# Patient Record
Sex: Female | Born: 1965
Health system: Southern US, Community
[De-identification: ages and names within clinical notes are randomized; demographics above are authoritative.]

## PROBLEM LIST (undated history)

## (undated) DIAGNOSIS — E785 Hyperlipidemia, unspecified: Secondary | ICD-10-CM

## (undated) DIAGNOSIS — N6009 Solitary cyst of unspecified breast: Secondary | ICD-10-CM

## (undated) DIAGNOSIS — R030 Elevated blood-pressure reading, without diagnosis of hypertension: Secondary | ICD-10-CM

## (undated) DIAGNOSIS — R195 Other fecal abnormalities: Secondary | ICD-10-CM

## (undated) HISTORY — DX: Hyperlipidemia, unspecified: E78.5

## (undated) HISTORY — DX: Solitary cyst of unspecified breast: N60.09

## (undated) HISTORY — PX: OTHER SURGICAL HISTORY: SHX169

## (undated) HISTORY — DX: Elevated blood-pressure reading, without diagnosis of hypertension: R03.0

## (undated) HISTORY — DX: Other fecal abnormalities: R19.5

---

## 2000-11-10 ENCOUNTER — Other Ambulatory Visit: Admission: RE | Admit: 2000-11-10 | Discharge: 2000-11-10 | Payer: Self-pay | Admitting: Obstetrics and Gynecology

## 2006-12-09 ENCOUNTER — Other Ambulatory Visit: Admission: RE | Admit: 2006-12-09 | Discharge: 2006-12-09 | Payer: Self-pay | Admitting: Obstetrics and Gynecology

## 2007-07-21 ENCOUNTER — Ambulatory Visit (HOSPITAL_COMMUNITY): Admission: RE | Admit: 2007-07-21 | Discharge: 2007-07-21 | Payer: Self-pay | Admitting: Obstetrics & Gynecology

## 2008-01-12 ENCOUNTER — Other Ambulatory Visit: Admission: RE | Admit: 2008-01-12 | Discharge: 2008-01-12 | Payer: Self-pay | Admitting: Obstetrics and Gynecology

## 2008-08-15 ENCOUNTER — Ambulatory Visit (HOSPITAL_COMMUNITY): Admission: RE | Admit: 2008-08-15 | Discharge: 2008-08-15 | Payer: Self-pay | Admitting: Family Medicine

## 2009-06-09 ENCOUNTER — Other Ambulatory Visit: Admission: RE | Admit: 2009-06-09 | Discharge: 2009-06-09 | Payer: Self-pay | Admitting: Obstetrics and Gynecology

## 2009-08-18 ENCOUNTER — Ambulatory Visit (HOSPITAL_COMMUNITY): Admission: RE | Admit: 2009-08-18 | Discharge: 2009-08-18 | Payer: Self-pay | Admitting: Family Medicine

## 2010-07-31 ENCOUNTER — Other Ambulatory Visit (HOSPITAL_COMMUNITY): Payer: Self-pay | Admitting: Family Medicine

## 2010-07-31 DIAGNOSIS — Z139 Encounter for screening, unspecified: Secondary | ICD-10-CM

## 2010-08-20 ENCOUNTER — Ambulatory Visit (HOSPITAL_COMMUNITY)
Admission: RE | Admit: 2010-08-20 | Discharge: 2010-08-20 | Disposition: A | Discharge status: Home / Self Care | Payer: 59 | Source: Ambulatory Visit | Attending: Family Medicine | Admitting: Family Medicine

## 2010-08-20 DIAGNOSIS — Z139 Encounter for screening, unspecified: Secondary | ICD-10-CM

## 2010-08-20 DIAGNOSIS — Z1231 Encounter for screening mammogram for malignant neoplasm of breast: Secondary | ICD-10-CM | POA: Insufficient documentation

## 2011-03-20 ENCOUNTER — Other Ambulatory Visit (HOSPITAL_COMMUNITY)
Admission: RE | Admit: 2011-03-20 | Discharge: 2011-03-20 | Disposition: A | Discharge status: Home / Self Care | Payer: 59 | Source: Ambulatory Visit | Attending: Obstetrics and Gynecology | Admitting: Obstetrics and Gynecology

## 2011-03-20 DIAGNOSIS — Z01419 Encounter for gynecological examination (general) (routine) without abnormal findings: Secondary | ICD-10-CM | POA: Insufficient documentation

## 2011-06-03 ENCOUNTER — Ambulatory Visit (HOSPITAL_COMMUNITY)
Admission: RE | Admit: 2011-06-03 | Discharge: 2011-06-03 | Disposition: A | Discharge status: Home / Self Care | Payer: 59 | Source: Ambulatory Visit | Attending: Family Medicine | Admitting: Family Medicine

## 2011-06-03 ENCOUNTER — Other Ambulatory Visit (HOSPITAL_COMMUNITY): Payer: Self-pay | Admitting: Family Medicine

## 2011-06-03 DIAGNOSIS — R52 Pain, unspecified: Secondary | ICD-10-CM

## 2011-06-03 DIAGNOSIS — M79609 Pain in unspecified limb: Secondary | ICD-10-CM | POA: Insufficient documentation

## 2011-06-03 DIAGNOSIS — M7989 Other specified soft tissue disorders: Secondary | ICD-10-CM | POA: Insufficient documentation

## 2011-09-11 ENCOUNTER — Other Ambulatory Visit (HOSPITAL_COMMUNITY): Payer: Self-pay | Admitting: Family Medicine

## 2011-09-11 DIAGNOSIS — Z139 Encounter for screening, unspecified: Secondary | ICD-10-CM

## 2011-09-16 ENCOUNTER — Ambulatory Visit (HOSPITAL_COMMUNITY)
Admission: RE | Admit: 2011-09-16 | Discharge: 2011-09-16 | Disposition: A | Discharge status: Home / Self Care | Payer: 59 | Source: Ambulatory Visit | Attending: Family Medicine | Admitting: Family Medicine

## 2011-09-16 DIAGNOSIS — Z139 Encounter for screening, unspecified: Secondary | ICD-10-CM

## 2011-09-16 DIAGNOSIS — Z1231 Encounter for screening mammogram for malignant neoplasm of breast: Secondary | ICD-10-CM | POA: Insufficient documentation

## 2012-09-01 ENCOUNTER — Other Ambulatory Visit (HOSPITAL_COMMUNITY): Payer: Self-pay | Admitting: Family Medicine

## 2012-09-01 DIAGNOSIS — Z139 Encounter for screening, unspecified: Secondary | ICD-10-CM

## 2012-09-21 ENCOUNTER — Ambulatory Visit (HOSPITAL_COMMUNITY)
Admission: RE | Admit: 2012-09-21 | Discharge: 2012-09-21 | Disposition: A | Discharge status: Home / Self Care | Payer: 59 | Source: Ambulatory Visit | Attending: Family Medicine | Admitting: Family Medicine

## 2012-09-21 DIAGNOSIS — Z1231 Encounter for screening mammogram for malignant neoplasm of breast: Secondary | ICD-10-CM | POA: Insufficient documentation

## 2012-09-21 DIAGNOSIS — Z139 Encounter for screening, unspecified: Secondary | ICD-10-CM

## 2012-09-28 ENCOUNTER — Other Ambulatory Visit: Payer: Self-pay | Admitting: Family Medicine

## 2012-09-28 DIAGNOSIS — R928 Other abnormal and inconclusive findings on diagnostic imaging of breast: Secondary | ICD-10-CM

## 2012-10-07 ENCOUNTER — Ambulatory Visit (HOSPITAL_COMMUNITY)
Admission: RE | Admit: 2012-10-07 | Discharge: 2012-10-07 | Disposition: A | Discharge status: Home / Self Care | Payer: 59 | Source: Ambulatory Visit | Attending: Family Medicine | Admitting: Family Medicine

## 2012-10-07 ENCOUNTER — Other Ambulatory Visit (HOSPITAL_COMMUNITY): Payer: Self-pay | Admitting: Family Medicine

## 2012-10-07 DIAGNOSIS — R928 Other abnormal and inconclusive findings on diagnostic imaging of breast: Secondary | ICD-10-CM

## 2012-10-07 DIAGNOSIS — N63 Unspecified lump in unspecified breast: Secondary | ICD-10-CM | POA: Insufficient documentation

## 2013-03-02 ENCOUNTER — Other Ambulatory Visit (HOSPITAL_COMMUNITY): Payer: Self-pay | Admitting: Family Medicine

## 2013-03-02 DIAGNOSIS — R928 Other abnormal and inconclusive findings on diagnostic imaging of breast: Secondary | ICD-10-CM

## 2013-03-11 ENCOUNTER — Other Ambulatory Visit (HOSPITAL_COMMUNITY)
Admission: RE | Admit: 2013-03-11 | Discharge: 2013-03-11 | Disposition: A | Discharge status: Home / Self Care | Payer: 59 | Source: Ambulatory Visit | Attending: Adult Health | Admitting: Adult Health

## 2013-03-11 ENCOUNTER — Encounter (INDEPENDENT_AMBULATORY_CARE_PROVIDER_SITE_OTHER): Payer: Self-pay

## 2013-03-11 ENCOUNTER — Ambulatory Visit (INDEPENDENT_AMBULATORY_CARE_PROVIDER_SITE_OTHER): Payer: 59 | Admitting: Adult Health

## 2013-03-11 ENCOUNTER — Encounter: Payer: Self-pay | Admitting: Adult Health

## 2013-03-11 VITALS — BP 130/78 | HR 78 | Ht 63.0 in | Wt 188.0 lb

## 2013-03-11 DIAGNOSIS — Z01419 Encounter for gynecological examination (general) (routine) without abnormal findings: Secondary | ICD-10-CM | POA: Insufficient documentation

## 2013-03-11 DIAGNOSIS — N6009 Solitary cyst of unspecified breast: Secondary | ICD-10-CM

## 2013-03-11 DIAGNOSIS — Z1151 Encounter for screening for human papillomavirus (HPV): Secondary | ICD-10-CM | POA: Insufficient documentation

## 2013-03-11 DIAGNOSIS — Z1212 Encounter for screening for malignant neoplasm of rectum: Secondary | ICD-10-CM

## 2013-03-11 HISTORY — DX: Solitary cyst of unspecified breast: N60.09

## 2013-03-11 LAB — HEMOCCULT GUIAC POC 1CARD (OFFICE): Fecal Occult Blood, POC: NEGATIVE

## 2013-03-11 NOTE — Patient Instructions (Addendum)
Physical in 1 year Labs in near future Mammogram yearly but [place] in march Colonoscopy at 48 Breast Cyst A breast cyst is a sac in the breast that is filled with fluid. Breast cysts are common in women. Women can have one or many cysts. When the breasts contain many cysts, it is usually due to a noncancerous (benign) condition called fibrocystic change. These lumps form under the influence of female hormones (estrogen and progesterone). The lumps are most often located in the upper, outer portion of the breast. They are often more swollen, painful, and tender before your period starts. They usually disappear after menopause, unless you are on hormone therapy.  There are several types of cysts:  Macrocyst. This is a cyst that is about 2 in. (5.1 cm) in diameter.   Microcyst. This is a tiny cyst that you cannot feel but can be seen with a mammogram or an ultrasound.   Galactocele. This is a cyst containing milk that may develop if you suddenly stop breastfeeding.   Sebaceous cyst of the skin. This type of cyst is not in the breast tissue itself. Breast cysts do not increase your risk of breast cancer. However, they must be monitored closely because they can be cancerous.  CAUSES  It is not known exactly what causes a breast cyst to form. Possible causes include:  An overgrowth of milk glands and connective tissue in the breast can block the milk glands, causing them to fill with fluid.   Scar tissue in the breast from previous surgery may block the glands, causing a cyst.  RISK FACTORS Estrogen may influence the development of a breast cyst.  SIGNS AND SYMPTOMS   Feeling a smooth, round, soft lump (like a grape) in the breast that is easily moveable.   Breast discomfort or pain.  Increase in size of the lump before your menstrual period and decrease in its size after your menstrual period.  DIAGNOSIS  A cyst can be felt during a physical exam by your health care provider. A breast  X-ray exam (mammogram) and ultrasonography will be done to confirm the diagnosis. Fluid may be removed from the cyst with a needle (fine needle aspiration) to make sure the cyst is not cancerous.  TREATMENT  Treatment may not be necessary. Your health care provider may monitor the cyst to see if it goes away on its own. If treatment is needed, it may include:  Hormone treatment.   Needle aspiration. There is a chance of the cyst coming back after aspiration.   Surgery to remove the whole cyst.  HOME CARE INSTRUCTIONS   Keep all follow-up appointments with your health care provider.  See your health care provider regularly:  Get a yearly exam by your health care provider.  Have a clinical breast exam by a health care provider every 1 3 years if you are 23 46 years of age. After age 70 years, you should have the exam every year.   Get mammogram tests as directed by your health care provider.   Understand the normal appearance and feel of your breasts and perform breast self-exams.   Only take over-the-counter or prescription medicines as directed by your health care provider.   Wear a supportive bra, especially when exercising.   Avoid caffeine.   Reduce your salt intake, especially before your menstrual period. Too much salt can cause fluid retention, breast swelling, and discomfort.  SEEK MEDICAL CARE IF:   You feel, or think you feel, a lump  in your breast.   You notice that both breasts look or feel different than usual.   Your breast is still causing pain after your menstrual period is over.   You need medicine for breast pain and swelling that occurs with your menstrual period.  SEEK IMMEDIATE MEDICAL CARE IF:   You have severe pain, tenderness, redness, or warmth in your breast.   You have nipple discharge or bleeding.   Your breast lump becomes hard and painful.   You find new lumps or bumps that were not there before.   You feel lumps in your  armpit (axilla).   You notice dimpling or wrinkling of the breast or nipple.   You have a fever.  MAKE SURE YOU:  Understand these instructions.  Will watch your condition.  Will get help right away if you are not doing well or get worse. Document Released: 01/21/2005 Document Revised: 09/23/2012 Document Reviewed: 08/20/2012 [facility] Patient Information 2014 [place], [place].

## 2013-03-11 NOTE — Progress Notes (Signed)
Patient ID: Natasha Oneal, female   DOB: 1965-05-15, 48 y.o.   MRN: [id number] History of Present Illness: Natasha Oneal is a 48 year old white female married in for a pap and physical.No complaints.Had flu shot at work.   Current Medications, Allergies, Past Medical History, Past Surgical History, Family History and Social History were reviewed in [employer] record.     Review of Systems: Patient denies any headaches, blurred vision, shortness of breath, chest pain, abdominal pain, problems with bowel movements, urination, or intercourse. No joint pain or mood swings. Did have a left breast cyst on mammogram in August, and [place] in September.    Physical Exam:BP 130/78  Pulse 78  Ht 5\' 3"  (1.6 m)  Wt 188 lb (85.276 kg)  BMI 33.31 kg/m2  LMP 03/06/2013 General:  Well developed, well nourished, no acute distress Skin:  Warm and dry Neck:  Midline trachea, normal thyroid Lungs; Clear to auscultation bilaterally Breast:  No dominant palpable mass, retraction, or nipple discharge Cardiovascular: Regular rate and rhythm Abdomen:  Soft, non tender, no hepatosplenomegaly Pelvic:  External genitalia is normal in appearance.  The vagina is normal in appearance.  The cervix is bulbous, pap with HPV performed.  Uterus is felt to be normal size, shape, and contour.  No  adnexal masses or tenderness noted. Rectal: Good sphincter tone, no polyps, or hemorrhoids felt.  Hemoccult negative. Extremities:  No swelling or varicosities noted Psych:  No mood changes, alert and cooperative, seems happy   Impression: Yearly gyn exam Left breast cyst    Plan: Physical in 1 year, pap in 3 years if -HPV Mammogram yearly get [place] in March Colonoscopy at 75 Get fasting labs in near future, CBC,CMP,TSH and lipids Review handout on breast cyst  Call prn

## 2013-04-07 ENCOUNTER — Ambulatory Visit (HOSPITAL_COMMUNITY)
Admission: RE | Admit: 2013-04-07 | Discharge: 2013-04-07 | Disposition: A | Discharge status: Home / Self Care | Payer: 59 | Source: Ambulatory Visit | Attending: Family Medicine | Admitting: Family Medicine

## 2013-04-07 ENCOUNTER — Other Ambulatory Visit (HOSPITAL_COMMUNITY): Payer: Self-pay | Admitting: Family Medicine

## 2013-04-07 DIAGNOSIS — N6009 Solitary cyst of unspecified breast: Secondary | ICD-10-CM | POA: Insufficient documentation

## 2013-04-07 DIAGNOSIS — R928 Other abnormal and inconclusive findings on diagnostic imaging of breast: Secondary | ICD-10-CM

## 2013-08-03 ENCOUNTER — Other Ambulatory Visit (HOSPITAL_COMMUNITY): Payer: Self-pay | Admitting: Family Medicine

## 2013-08-03 DIAGNOSIS — Z139 Encounter for screening, unspecified: Secondary | ICD-10-CM

## 2013-09-22 ENCOUNTER — Ambulatory Visit (HOSPITAL_COMMUNITY)
Admission: RE | Admit: 2013-09-22 | Discharge: 2013-09-22 | Disposition: A | Discharge status: Home / Self Care | Payer: 59 | Source: Ambulatory Visit | Attending: Family Medicine | Admitting: Family Medicine

## 2013-09-22 DIAGNOSIS — Z1231 Encounter for screening mammogram for malignant neoplasm of breast: Secondary | ICD-10-CM | POA: Insufficient documentation

## 2013-09-22 DIAGNOSIS — Z139 Encounter for screening, unspecified: Secondary | ICD-10-CM

## 2013-12-06 ENCOUNTER — Encounter: Payer: Self-pay | Admitting: Adult Health

## 2014-03-16 ENCOUNTER — Ambulatory Visit (INDEPENDENT_AMBULATORY_CARE_PROVIDER_SITE_OTHER): Payer: 59 | Admitting: Adult Health

## 2014-03-16 ENCOUNTER — Encounter: Payer: Self-pay | Admitting: Adult Health

## 2014-03-16 VITALS — BP 140/88 | HR 78 | Ht 63.5 in | Wt 172.0 lb

## 2014-03-16 DIAGNOSIS — Z01419 Encounter for gynecological examination (general) (routine) without abnormal findings: Secondary | ICD-10-CM

## 2014-03-16 DIAGNOSIS — R030 Elevated blood-pressure reading, without diagnosis of hypertension: Secondary | ICD-10-CM

## 2014-03-16 DIAGNOSIS — R195 Other fecal abnormalities: Secondary | ICD-10-CM

## 2014-03-16 DIAGNOSIS — Z1212 Encounter for screening for malignant neoplasm of rectum: Secondary | ICD-10-CM

## 2014-03-16 DIAGNOSIS — IMO0001 Reserved for inherently not codable concepts without codable children: Secondary | ICD-10-CM | POA: Insufficient documentation

## 2014-03-16 HISTORY — DX: Reserved for inherently not codable concepts without codable children: IMO0001

## 2014-03-16 HISTORY — DX: Other fecal abnormalities: R19.5

## 2014-03-16 LAB — HEMOCCULT GUIAC POC 1CARD (OFFICE): FECAL OCCULT BLD: POSITIVE

## 2014-03-16 NOTE — Progress Notes (Signed)
Patient ID: Natasha Oneal, female   DOB: 10/13/1965, 49 y.o.   MRN: [id number] History of Present Illness: Natasha Oneal is a 49 year old white female, married, in for well woman gyn exam.She had normal pap with negative HPV 03/11/13.She had cellulitis left breast in October.Her A1c was 5.7 in November and she has decreased carbs and has lost 16 lbs since last years visit.   Current Medications, Allergies, Past Medical History, Past Surgical History, Family History and Social History were reviewed in [employer] record.     Review of Systems: Patient denies any headaches, hearing loss, fatigue, blurred vision, shortness of breath, chest pain, abdominal pain, problems with bowel movements, urination, or intercourse. No joint pain or mood swings.   Physical Exam:BP 140/88 mmHg  Pulse 78  Ht 5' 3.5" (1.613 m)  Wt 172 lb (78.019 kg)  BMI 29.99 kg/m2BP was 148/90 on arrival General:  Well developed, well nourished, no acute distress Skin:  Warm and dry Neck:  Midline trachea, normal thyroid, good ROM, no lymphadenopathy Lungs; Clear to auscultation bilaterally Breast:  No dominant palpable mass, retraction, or nipple discharge Cardiovascular: Regular rate and rhythm Abdomen:  Soft, non tender, no hepatosplenomegaly Pelvic:  External genitalia is normal in appearance, no lesions.  The vagina is normal in appearance, wit good color,mositure and rugae. Urethra has no lesions or masses. The cervix is bulbous.  Uterus is felt to be normal size, shape, and contour.  No adnexal masses or tenderness noted.Bladder is non tender, no masses felt. Rectal: Good sphincter tone, no polyps, or hemorrhoids felt.  Hemoccult positive . Extremities/musculoskeletal:  No swelling or varicosities noted, no clubbing or cyanosis Psych:  No mood changes, alert and cooperative,seems happy Discussed doing 3 hemoccult cards and if any positive will refer to GI, but could be hemorrhoid, tear or  irritation.  Impression: Well woman gyn exam no pap +hemoccult Elevated BP    Plan: Check BP at work and let me know readings Will send 3 hemoccult cards home to do and return Decrease salt Mammogram yearly Physical in 1 year

## 2014-03-16 NOTE — Patient Instructions (Signed)
physical in 1 year Mammogram yearly Check BP  Do cards and return

## 2014-03-22 ENCOUNTER — Other Ambulatory Visit (INDEPENDENT_AMBULATORY_CARE_PROVIDER_SITE_OTHER): Payer: 59

## 2014-03-22 DIAGNOSIS — Z01419 Encounter for gynecological examination (general) (routine) without abnormal findings: Secondary | ICD-10-CM

## 2014-03-22 DIAGNOSIS — Z1212 Encounter for screening for malignant neoplasm of rectum: Secondary | ICD-10-CM

## 2014-03-23 LAB — HEMOCCULT GUIAC POC 1CARD (OFFICE)
Card #3 Fecal Occult Blood, POC: NEGATIVE
FECAL OCCULT BLD: NEGATIVE
Fecal Occult Blood, POC: NEGATIVE

## 2014-03-23 NOTE — Progress Notes (Signed)
Pt brought in hemocult cards. They were negative x 3. Pt aware. [place]

## 2014-08-22 ENCOUNTER — Other Ambulatory Visit (HOSPITAL_COMMUNITY): Payer: Self-pay | Admitting: Family Medicine

## 2014-08-22 DIAGNOSIS — Z1231 Encounter for screening mammogram for malignant neoplasm of breast: Secondary | ICD-10-CM

## 2014-09-28 ENCOUNTER — Ambulatory Visit (HOSPITAL_COMMUNITY)
Admission: RE | Admit: 2014-09-28 | Discharge: 2014-09-28 | Disposition: A | Discharge status: Home / Self Care | Payer: 59 | Source: Ambulatory Visit | Attending: Family Medicine | Admitting: Family Medicine

## 2014-09-28 DIAGNOSIS — Z1231 Encounter for screening mammogram for malignant neoplasm of breast: Secondary | ICD-10-CM | POA: Diagnosis not present

## 2015-03-20 DIAGNOSIS — R7309 Other abnormal glucose: Secondary | ICD-10-CM | POA: Diagnosis not present

## 2015-03-20 DIAGNOSIS — R030 Elevated blood-pressure reading, without diagnosis of hypertension: Secondary | ICD-10-CM | POA: Diagnosis not present

## 2015-03-20 DIAGNOSIS — E669 Obesity, unspecified: Secondary | ICD-10-CM | POA: Diagnosis not present

## 2015-04-27 DIAGNOSIS — E669 Obesity, unspecified: Secondary | ICD-10-CM | POA: Diagnosis not present

## 2015-04-27 DIAGNOSIS — R030 Elevated blood-pressure reading, without diagnosis of hypertension: Secondary | ICD-10-CM | POA: Diagnosis not present

## 2015-04-27 DIAGNOSIS — R7309 Other abnormal glucose: Secondary | ICD-10-CM | POA: Diagnosis not present

## 2015-04-27 DIAGNOSIS — Z13220 Encounter for screening for lipoid disorders: Secondary | ICD-10-CM | POA: Diagnosis not present

## 2015-05-08 DIAGNOSIS — H5213 Myopia, bilateral: Secondary | ICD-10-CM | POA: Diagnosis not present

## 2015-05-08 DIAGNOSIS — H52223 Regular astigmatism, bilateral: Secondary | ICD-10-CM | POA: Diagnosis not present

## 2015-09-13 ENCOUNTER — Other Ambulatory Visit (HOSPITAL_COMMUNITY): Payer: Self-pay | Admitting: Family Medicine

## 2015-09-13 DIAGNOSIS — Z1231 Encounter for screening mammogram for malignant neoplasm of breast: Secondary | ICD-10-CM

## 2015-10-02 ENCOUNTER — Ambulatory Visit (HOSPITAL_COMMUNITY)
Admission: RE | Admit: 2015-10-02 | Discharge: 2015-10-02 | Disposition: A | Discharge status: Home / Self Care | Payer: 59 | Source: Ambulatory Visit | Attending: Family Medicine | Admitting: Family Medicine

## 2015-10-02 DIAGNOSIS — Z1231 Encounter for screening mammogram for malignant neoplasm of breast: Secondary | ICD-10-CM

## 2015-10-04 ENCOUNTER — Other Ambulatory Visit: Payer: Self-pay | Admitting: Family Medicine

## 2015-10-04 DIAGNOSIS — R928 Other abnormal and inconclusive findings on diagnostic imaging of breast: Secondary | ICD-10-CM

## 2015-10-11 ENCOUNTER — Ambulatory Visit
Admission: RE | Admit: 2015-10-11 | Discharge: 2015-10-11 | Disposition: A | Discharge status: Home / Self Care | Payer: 59 | Source: Ambulatory Visit | Attending: Family Medicine | Admitting: Family Medicine

## 2015-10-11 DIAGNOSIS — R928 Other abnormal and inconclusive findings on diagnostic imaging of breast: Secondary | ICD-10-CM | POA: Diagnosis not present

## 2015-10-11 DIAGNOSIS — N6002 Solitary cyst of left breast: Secondary | ICD-10-CM | POA: Diagnosis not present

## 2016-07-04 DIAGNOSIS — H52223 Regular astigmatism, bilateral: Secondary | ICD-10-CM | POA: Diagnosis not present

## 2016-07-04 DIAGNOSIS — H5213 Myopia, bilateral: Secondary | ICD-10-CM | POA: Diagnosis not present

## 2016-07-04 DIAGNOSIS — H524 Presbyopia: Secondary | ICD-10-CM | POA: Diagnosis not present

## 2016-08-20 ENCOUNTER — Other Ambulatory Visit: Payer: 59 | Admitting: Adult Health

## 2016-09-09 ENCOUNTER — Ambulatory Visit (INDEPENDENT_AMBULATORY_CARE_PROVIDER_SITE_OTHER): Payer: 59 | Admitting: Adult Health

## 2016-09-09 ENCOUNTER — Other Ambulatory Visit (HOSPITAL_COMMUNITY)
Admission: RE | Admit: 2016-09-09 | Discharge: 2016-09-09 | Disposition: A | Discharge status: Home / Self Care | Payer: 59 | Source: Ambulatory Visit | Attending: Adult Health | Admitting: Adult Health

## 2016-09-09 ENCOUNTER — Encounter: Payer: Self-pay | Admitting: Adult Health

## 2016-09-09 VITALS — BP 120/72 | HR 86 | Ht 63.5 in | Wt 153.0 lb

## 2016-09-09 DIAGNOSIS — Z01419 Encounter for gynecological examination (general) (routine) without abnormal findings: Secondary | ICD-10-CM | POA: Insufficient documentation

## 2016-09-09 DIAGNOSIS — Z13220 Encounter for screening for lipoid disorders: Secondary | ICD-10-CM

## 2016-09-09 DIAGNOSIS — Z1212 Encounter for screening for malignant neoplasm of rectum: Secondary | ICD-10-CM

## 2016-09-09 DIAGNOSIS — Z1211 Encounter for screening for malignant neoplasm of colon: Secondary | ICD-10-CM

## 2016-09-09 DIAGNOSIS — Z131 Encounter for screening for diabetes mellitus: Secondary | ICD-10-CM

## 2016-09-09 DIAGNOSIS — Z1321 Encounter for screening for nutritional disorder: Secondary | ICD-10-CM

## 2016-09-09 LAB — HEMOCCULT GUIAC POC 1CARD (OFFICE): Fecal Occult Blood, POC: NEGATIVE

## 2016-09-09 NOTE — Progress Notes (Signed)
Patient ID: Natasha Oneal, female   DOB: 1965-11-14, 51 y.o.   MRN: [id number] History of Present Illness: Natasha "Natasha Oneal" is a 51 year old white female, married, G1P1, in for a well woman gyn exam and pap.She has lost about 35 lbs since last pap(she was trying). PCP is Dr Natasha Oneal.    Current Medications, Allergies, Past Medical History, Past Surgical History, Family History and Social History were reviewed in [employer] record.     Review of Systems: Patient denies any headaches, hearing loss, fatigue, blurred vision, shortness of breath, chest pain, abdominal pain, problems with bowel movements, urination, or intercourse. No joint pain or mood swings.    Physical Exam:BP 120/72 (BP Location: Left Arm, Patient Position: Sitting, Cuff Size: Normal)   Pulse 86   Ht 5' 3.5" (1.613 m)   Wt 153 lb (69.4 kg)   LMP 08/21/2016   BMI 26.68 kg/m  General:  Well developed, well nourished, no acute distress Skin:  Warm and dry Neck:  Midline trachea, normal thyroid, good ROM, no lymphadenopathy Lungs; Clear to auscultation bilaterally Breast:  No dominant palpable mass, retraction, or nipple discharge Cardiovascular: Regular rate and rhythm Abdomen:  Soft, non tender, no hepatosplenomegaly Pelvic:  External genitalia is normal in appearance, no lesions.  The vagina is normal in appearance. Urethra has no lesions or masses. The cervix is bulbous. Pap with HPV performed. Uterus is felt to be normal size, shape, and contour.  No adnexal masses or tenderness noted.Bladder is non tender, no masses felt. Rectal: Good sphincter tone, no polyps, or hemorrhoids felt.  Hemoccult negative. Extremities/musculoskeletal:  No swelling or varicosities noted, no clubbing or cyanosis Psych:  No mood changes, alert and cooperative,seems happy PHQ 2 score 0.  Impression: 1. Well woman exam with routine gynecological exam   2. Screening for colorectal cancer   3. Screening  cholesterol level   4. Screening for diabetes mellitus   5. Encounter for vitamin deficiency screening       Plan: Check CBC,CMP,TSH and lipids,A1c and vitamin D Mammogram yearly Colonoscopy at 52 Physical in 1 year, pap in 3 if normal

## 2016-09-10 LAB — CYTOLOGY - PAP
Diagnosis: NEGATIVE
HPV (WINDOPATH): NOT DETECTED

## 2016-09-13 ENCOUNTER — Other Ambulatory Visit (HOSPITAL_COMMUNITY): Payer: Self-pay | Admitting: Family Medicine

## 2016-09-13 DIAGNOSIS — Z13220 Encounter for screening for lipoid disorders: Secondary | ICD-10-CM | POA: Diagnosis not present

## 2016-09-13 DIAGNOSIS — Z01419 Encounter for gynecological examination (general) (routine) without abnormal findings: Secondary | ICD-10-CM | POA: Diagnosis not present

## 2016-09-13 DIAGNOSIS — Z1321 Encounter for screening for nutritional disorder: Secondary | ICD-10-CM | POA: Diagnosis not present

## 2016-09-13 DIAGNOSIS — Z1231 Encounter for screening mammogram for malignant neoplasm of breast: Secondary | ICD-10-CM

## 2016-09-13 DIAGNOSIS — Z131 Encounter for screening for diabetes mellitus: Secondary | ICD-10-CM | POA: Diagnosis not present

## 2016-09-14 LAB — COMPREHENSIVE METABOLIC PANEL
A/G RATIO: 1.8 (ref 1.2–2.2)
ALBUMIN: 4.6 g/dL (ref 3.5–5.5)
ALT: 15 IU/L (ref 0–32)
AST: 22 IU/L (ref 0–40)
Alkaline Phosphatase: 58 IU/L (ref 39–117)
BILIRUBIN TOTAL: 0.4 mg/dL (ref 0.0–1.2)
BUN/Creatinine Ratio: 22 (ref 9–23)
BUN: 14 mg/dL (ref 6–24)
CHLORIDE: 104 mmol/L (ref 96–106)
CO2: 24 mmol/L (ref 20–29)
Calcium: 9.5 mg/dL (ref 8.7–10.2)
Creatinine, Ser: 0.65 mg/dL (ref 0.57–1.00)
GFR calc Af Amer: 121 mL/min/{1.73_m2} (ref >59)
GFR, EST NON AFRICAN AMERICAN: 105 mL/min/{1.73_m2} (ref >59)
Globulin, Total: 2.5 g/dL (ref 1.5–4.5)
Glucose: 94 mg/dL (ref 65–99)
POTASSIUM: 4.4 mmol/L (ref 3.5–5.2)
Sodium: 143 mmol/L (ref 134–144)
TOTAL PROTEIN: 7.1 g/dL (ref 6.0–8.5)

## 2016-09-14 LAB — LIPID PANEL
CHOLESTEROL TOTAL: 218 mg/dL — AB (ref 100–199)
Chol/HDL Ratio: 3.3 ratio (ref 0.0–4.4)
HDL: 66 mg/dL (ref >39)
LDL Calculated: 134 mg/dL — ABNORMAL HIGH (ref 0–99)
Triglycerides: 92 mg/dL (ref 0–149)
VLDL Cholesterol Cal: 18 mg/dL (ref 5–40)

## 2016-09-14 LAB — HEMOGLOBIN A1C
ESTIMATED AVERAGE GLUCOSE: 105 mg/dL
Hgb A1c MFr Bld: 5.3 % (ref 4.8–5.6)

## 2016-09-14 LAB — CBC
HEMOGLOBIN: 13.6 g/dL (ref 11.1–15.9)
Hematocrit: 40.9 % (ref 34.0–46.6)
MCH: 29.3 pg (ref 26.6–33.0)
MCHC: 33.3 g/dL (ref 31.5–35.7)
MCV: 88 fL (ref 79–97)
Platelets: 199 10*3/uL (ref 150–379)
RBC: 4.64 x10E6/uL (ref 3.77–5.28)
RDW: 13.7 % (ref 12.3–15.4)
WBC: 5.3 10*3/uL (ref 3.4–10.8)

## 2016-09-14 LAB — VITAMIN D 25 HYDROXY (VIT D DEFICIENCY, FRACTURES): VIT D 25 HYDROXY: 28.5 ng/mL — AB (ref 30.0–100.0)

## 2016-09-14 LAB — TSH: TSH: 3.09 u[IU]/mL (ref 0.450–4.500)

## 2016-09-16 ENCOUNTER — Telehealth: Payer: Self-pay | Admitting: Adult Health

## 2016-09-16 DIAGNOSIS — E559 Vitamin D deficiency, unspecified: Secondary | ICD-10-CM | POA: Insufficient documentation

## 2016-09-16 MED ORDER — VITAMIN D 50 MCG (2000 UT) PO CAPS: ORAL_CAPSULE | ORAL | Status: DC

## 2016-09-16 NOTE — Telephone Encounter (Signed)
Left message that labs good, except vitamin D 3 a little low, take 2000 IU vitamin D 3 daily

## 2016-10-16 ENCOUNTER — Ambulatory Visit (HOSPITAL_COMMUNITY)
Admission: RE | Admit: 2016-10-16 | Discharge: 2016-10-16 | Disposition: A | Discharge status: Home / Self Care | Payer: 59 | Source: Ambulatory Visit | Attending: Family Medicine | Admitting: Family Medicine

## 2016-10-16 DIAGNOSIS — Z1231 Encounter for screening mammogram for malignant neoplasm of breast: Secondary | ICD-10-CM | POA: Diagnosis not present

## 2017-08-12 DIAGNOSIS — H524 Presbyopia: Secondary | ICD-10-CM | POA: Diagnosis not present

## 2017-08-12 DIAGNOSIS — H52223 Regular astigmatism, bilateral: Secondary | ICD-10-CM | POA: Diagnosis not present

## 2017-08-12 DIAGNOSIS — H5213 Myopia, bilateral: Secondary | ICD-10-CM | POA: Diagnosis not present

## 2017-09-18 ENCOUNTER — Encounter: Payer: Self-pay | Admitting: Adult Health

## 2017-09-25 ENCOUNTER — Other Ambulatory Visit: Payer: Self-pay | Admitting: Obstetrics and Gynecology

## 2017-09-25 DIAGNOSIS — Z1231 Encounter for screening mammogram for malignant neoplasm of breast: Secondary | ICD-10-CM

## 2017-10-14 ENCOUNTER — Other Ambulatory Visit: Payer: 59 | Admitting: Adult Health

## 2017-10-16 ENCOUNTER — Other Ambulatory Visit: Payer: 59 | Admitting: Adult Health

## 2017-10-20 ENCOUNTER — Ambulatory Visit (INDEPENDENT_AMBULATORY_CARE_PROVIDER_SITE_OTHER): Payer: 59 | Admitting: Adult Health

## 2017-10-20 ENCOUNTER — Encounter: Payer: Self-pay | Admitting: Adult Health

## 2017-10-20 ENCOUNTER — Encounter (INDEPENDENT_AMBULATORY_CARE_PROVIDER_SITE_OTHER): Payer: Self-pay

## 2017-10-20 VITALS — BP 132/86 | HR 66 | Ht 63.0 in | Wt 148.4 lb

## 2017-10-20 DIAGNOSIS — Z01419 Encounter for gynecological examination (general) (routine) without abnormal findings: Secondary | ICD-10-CM

## 2017-10-20 DIAGNOSIS — Z1211 Encounter for screening for malignant neoplasm of colon: Secondary | ICD-10-CM | POA: Insufficient documentation

## 2017-10-20 DIAGNOSIS — Z1212 Encounter for screening for malignant neoplasm of rectum: Secondary | ICD-10-CM | POA: Diagnosis not present

## 2017-10-20 DIAGNOSIS — R61 Generalized hyperhidrosis: Secondary | ICD-10-CM | POA: Insufficient documentation

## 2017-10-20 DIAGNOSIS — N951 Menopausal and female climacteric states: Secondary | ICD-10-CM | POA: Insufficient documentation

## 2017-10-20 DIAGNOSIS — Z780 Asymptomatic menopausal state: Secondary | ICD-10-CM

## 2017-10-20 LAB — HEMOCCULT GUIAC POC 1CARD (OFFICE): FECAL OCCULT BLD: NEGATIVE

## 2017-10-20 NOTE — Progress Notes (Addendum)
Patient ID: Natasha Oneal, female   DOB: 1965-06-07, 52 y.o.   MRN: [id number] History of Present Illness: Natasha Oneal is a 52 year old white female,married, in for a well woman gyn exam, she had a normal pap with negative HPV 09/09/16. PCP is Dr Karie Kirks.    Current Medications, Allergies, Past Medical History, Past Surgical History, Family History and Social History were reviewed in [employer] record.     Review of Systems: Patient denies any headaches, hearing loss, fatigue, blurred vision, shortness of breath, chest pain, abdominal pain, problems with bowel movements, urination, or intercourse. No joint pain or mood swings. No period in over a year, +hot flashes and night sweats She exercises 6 days a week and eats healthy now.   Physical Exam:BP 132/86 (BP Location: Left Arm, Patient Position: Sitting, Cuff Size: Normal)   Pulse 66   Ht 5\' 3"  (1.6 m)   Wt 148 lb 6.4 oz (67.3 kg)   LMP 08/19/2016   BMI 26.29 kg/m Has lost 40 lbs in last 4 years. General:  Well developed, well nourished, no acute distress Skin:  Warm and dry Neck:  Midline trachea, normal thyroid, good ROM, no lymphadenopathy Lungs; Clear to auscultation bilaterally Breast:  No dominant palpable mass, retraction, or nipple discharge Cardiovascular: Regular rate and rhythm Abdomen:  Soft, non tender, no hepatosplenomegaly Pelvic:  External genitalia is normal in appearance, no lesions.  The vagina is normal in appearance. Urethra has no lesions or masses. The cervix is bulbous.  Uterus is felt to be normal size, shape, and contour.  No adnexal masses or tenderness noted.Bladder is non tender, no masses felt. Rectal: Good sphincter tone, no polyps, or hemorrhoids felt.  Hemoccult negative. Extremities/musculoskeletal:  No swelling or varicosities noted, no clubbing or cyanosis Psych:  No mood changes, alert and cooperative,seems happy PHQ 9 score 0. Examination chaperoned by Diona Fanti CMA.   Discussed HRT and she declines for now.   Impression: 1. Encounter for well woman exam with routine gynecological exam   2. Screening for colorectal cancer   3. Hot flashes due to menopause   4. Night sweats   5. Post-menopausal       Plan: Physical in 1 year Pap in 2021 Mammogram 9/19 and yearly Fasting labs next year Colonoscopy advised, referred to Dr Gala Romney

## 2017-10-20 NOTE — Patient Instructions (Signed)
Menopause Menopause is the normal time of life when menstrual periods stop completely. Menopause is complete when you have missed 12 consecutive menstrual periods. It usually occurs between the ages of 48 years and 55 years. Very rarely does a woman develop menopause before the age of 40 years. At menopause, your ovaries stop producing the female hormones estrogen and progesterone. This can cause undesirable symptoms and also affect your health. Sometimes the symptoms may occur 4-5 years before the menopause begins. There is no relationship between menopause and:  Oral contraceptives.  Number of children you had.  Race.  The age your menstrual periods started (menarche).  Heavy smokers and very thin women may develop menopause earlier in life. What are the causes?  The ovaries stop producing the female hormones estrogen and progesterone. Other causes include:  Surgery to remove both ovaries.  The ovaries stop functioning for no known reason.  Tumors of the pituitary gland in the brain.  Medical disease that affects the ovaries and hormone production.  Radiation treatment to the abdomen or pelvis.  Chemotherapy that affects the ovaries.  What are the signs or symptoms?  Hot flashes.  Night sweats.  Decrease in sex drive.  Vaginal dryness and thinning of the vagina causing painful intercourse.  Dryness of the skin and developing wrinkles.  Headaches.  Tiredness.  Irritability.  Memory problems.  Weight gain.  Bladder infections.  Hair growth of the face and chest.  Infertility. More serious symptoms include:  Loss of bone (osteoporosis) causing breaks (fractures).  Depression.  Hardening and narrowing of the arteries (atherosclerosis) causing heart attacks and strokes.  How is this diagnosed?  When the menstrual periods have stopped for 12 straight months.  Physical exam.  Hormone studies of the blood. How is this treated? There are many treatment  choices and nearly as many questions about them. The decisions to treat or not to treat menopausal changes is an individual choice made with your health care provider. Your health care provider can discuss the treatments with you. Together, you can decide which treatment will work best for you. Your treatment choices may include:  Hormone therapy (estrogen and progesterone).  Non-hormonal medicines.  Treating the individual symptoms with medicine (for example antidepressants for depression).  Herbal medicines that may help specific symptoms.  Counseling by a psychiatrist or psychologist.  Group therapy.  Lifestyle changes including: ? Eating healthy. ? Regular exercise. ? Limiting caffeine and alcohol. ? Stress management and meditation.  No treatment.  Follow these instructions at home:  Take the medicine your health care provider gives you as directed.  Get plenty of sleep and rest.  Exercise regularly.  Eat a diet that contains calcium (good for the bones) and soy products (acts like estrogen hormone).  Avoid alcoholic beverages.  Do not smoke.  If you have hot flashes, dress in layers.  Take supplements, calcium, and vitamin D to strengthen bones.  You can use over-the-counter lubricants or moisturizers for vaginal dryness.  Group therapy is sometimes very helpful.  Acupuncture may be helpful in some cases. Contact a health care provider if:  You are not sure you are in menopause.  You are having menopausal symptoms and need advice and treatment.  You are still having menstrual periods after age 55 years.  You have pain with intercourse.  Menopause is complete (no menstrual period for 12 months) and you develop vaginal bleeding.  You need a referral to a specialist (gynecologist, psychiatrist, or psychologist) for treatment. Get help right   away if:  You have severe depression.  You have excessive vaginal bleeding.  You fell and think you have a  broken bone.  You have pain when you urinate.  You develop leg or chest pain.  You have a fast pounding heart beat (palpitations).  You have severe headaches.  You develop vision problems.  You feel a lump in your breast.  You have abdominal pain or severe indigestion. This information is not intended to replace advice given to you by your health care provider. Make sure you discuss any questions you have with your health care provider. Document Released: 04/13/2003 Document Revised: 06/29/2015 Document Reviewed: 08/20/2012 Elsevier Interactive Patient Education  2017 [employer]  

## 2017-10-23 ENCOUNTER — Ambulatory Visit (HOSPITAL_COMMUNITY)
Admission: RE | Admit: 2017-10-23 | Discharge: 2017-10-23 | Disposition: A | Discharge status: Home / Self Care | Payer: 59 | Source: Ambulatory Visit | Attending: Obstetrics and Gynecology | Admitting: Obstetrics and Gynecology

## 2017-10-23 DIAGNOSIS — Z1231 Encounter for screening mammogram for malignant neoplasm of breast: Secondary | ICD-10-CM | POA: Insufficient documentation

## 2017-11-03 ENCOUNTER — Encounter: Payer: Self-pay | Admitting: Internal Medicine

## 2017-11-03 ENCOUNTER — Telehealth: Payer: Self-pay | Admitting: Gastroenterology

## 2017-11-03 NOTE — Telephone Encounter (Signed)
Per Erline Levine, this message was sent in error. Wrong pt.

## 2017-11-03 NOTE — Telephone Encounter (Signed)
[phone] please call patient, he said that he is retaining a lot of fluid and his belly button is leaking, no pain but he has to keep a towel on it and can not get it to stop

## 2017-11-24 ENCOUNTER — Ambulatory Visit (INDEPENDENT_AMBULATORY_CARE_PROVIDER_SITE_OTHER): Payer: Self-pay

## 2017-11-24 DIAGNOSIS — Z1211 Encounter for screening for malignant neoplasm of colon: Secondary | ICD-10-CM

## 2017-11-24 MED ORDER — CLENPIQ 10-3.5-12 MG-GM -GM/160ML PO SOLN: 1.0000 | Freq: Once | ORAL | 0 refills | Status: AC

## 2017-11-24 MED FILL — CLENPIQ 10-3.5-12 MG-GM -GM: 10-3.5-12 M | 1 days supply | Qty: 320 | Fill #0

## 2017-11-24 NOTE — Patient Instructions (Signed)
[facility] INSTRUCTIONS   Patient Name:  CLYDINE PARKISON Date of procedure:  01/07/18 Time to register at [facility] Stay:  12:00pm Provider:  Dr. Gala Romney   Please notify us immediately if you are diabetic, take iron supplements, or if you are on coumadin or any blood thinners.  Note: Do NOT refrigerate or freeze CLENPIQ. CLENPIQ is ready to drink. There is no need to add any other liquid or mix the medicine in the bottle before you start dosing.   01/06/18  1 Day prior to procedure:     CLEAR LIQUIDS ALL DAY--NO SOLID FOODS OR DAIRY PRODUCTS! See list of liquids that are allowed and items that are NOT allowed below.     You must drink plenty of CLEAR LIQUIDS starting before your bowel prep. It is important to stay adequately hydrated before, during, and after your bowel prep for the prep to work effectively!    At 5:00 PM Begin the prep as follows:    1. Drink one bottle of premixed CLENPIQ right from the bottle. 2. Drink at least five (5) 8-ounce drinks of clear liquids of your choice within the next 5 hours   Continue clear liquids.    01/07/18  Day of Procedure   You may take TYLENOL products. Please continue your regular medications unless we have instructed you otherwise.    5 hours before procedure @ 8:00am:  1. Drink second bottle of premixed CLENPIQ right from the bottle.   2. Drink at least three (3) 8-ounce drinks of clear liquids of your choice within the next 2 hours. You can drink more if needed.   3 hours before your procedure time @ 10:00 am: Stop drinking all liquids, nothing by mouth at this point.  Please note, on the day of your procedure you MUST be accompanied by an adult who is willing to assume responsibility for you at time of discharge. If you do not have such person with you, your procedure will have to be rescheduled.                                                                                                                      Please leave ALL jewelry at home prior to coming to the hospital for your procedure.   *It is your responsibility to check with your insurance company for the benefits of coverage you have for this procedure. Unfortunately, not all insurance companies have benefits to cover all or part of these types of procedures. It is your responsibility to check your benefits, however we will be glad to assist you with any codes your insurance company may need.   Please note that most insurance companies will not cover a screening colonoscopy for people under the age of 4  For example, with some insurance companies you may have benefits for a screening colonoscopy, but if polyps are found the diagnosis will change and then you may have a deductible that will need to be met.  Please make sure you check your benefits for screening colonoscopy as well as a diagnostic colonoscopy.   CLEAR LIQUIDS: (NO RED or PURPLE) Water  Jello   Apple Juice  White Grape Juice   Kool-Aid Soft drinks  Banana popsicles Sports Drink  Black coffee (No cream or milk) Tea (No cream or milk)  Broth (fat free beef/chicken/vegetable)  Clear liquids allow you to see your fingers on the other side of the glass.  Be sure they are NOT RED or PURPLE in color, cloudy, but CLEAR.  Do Not Eat: Dairy products of any kind Cranberry juice Tomato or V8 Juice  Orange Juice   Grapefruit Juice Red Grape Juice Alcohol   Non-dairy creamer Solid foods like cereal, oatmeal, yogurt, fruits, vegetables, creamed soups, eggs, bread, etc   HELPFUL HINTS TO MAKE DRINKING EASIER: -Trying drinking through a straw. -If you become nauseated, try consuming smaller amounts or stretch out the time between glasses.  Stop for 30 minutes & slowly start back drinking.  Call our office with any questions or concerns at [phone].  Thank You

## 2017-11-24 NOTE — Progress Notes (Addendum)
Gastroenterology Pre-Procedure Review  Request Date:11/24/17 Requesting Physician: Derrek Monaco NP [facility]- no previous tcs  PATIENT REVIEW QUESTIONS: The patient responded to the following health history questions as indicated:    1. Diabetes Melitis: no 2. Joint replacements in the past 12 months: no 3. Major health problems in the past 3 months: no 4. Has an artificial valve or MVP: no 5. Has a defibrillator: no 6. Has been advised in past to take antibiotics in advance of a procedure like teeth cleaning: no 7. Family history of colon cancer: no  8. Alcohol Use: no 9. History of sleep apnea: no  10. History of coronary artery or other vascular stents placed within the last 12 months: no 11. History of any prior anesthesia complications: no    MEDICATIONS & ALLERGIES:    Patient reports the following regarding taking any blood thinners:   Plavix? no Aspirin? no Coumadin? no Brilinta? no Xarelto? no Eliquis? no Pradaxa? no Savaysa? no Effient? no  Patient confirms/reports the following medications:  Current Outpatient Medications  Medication Sig Dispense Refill  . Cholecalciferol (VITAMIN D) 2000 units CAPS Take 1 daily 30 capsule    No current facility-administered medications for this visit.     Patient confirms/reports the following allergies:  No Known Allergies  No orders of the defined types were placed in this encounter.   AUTHORIZATION INFORMATION Primary Insurance: [place],  [place] #: [id number] Pre-Cert / Josem Kaufmann required: no per Casilda Carls / Auth #: [id number]  SCHEDULE INFORMATION: Procedure has been scheduled as follows:  Date: 01/07/18, Time:1:00 Location: [facility] Dr.Rourk  This Gastroenterology Pre-Precedure Review Form is being routed to the following provider(s): Walden Field NP

## 2017-11-25 NOTE — Progress Notes (Signed)
Ok to schedule.

## 2018-01-07 ENCOUNTER — Encounter (HOSPITAL_COMMUNITY)
Admission: RE | Disposition: A | Discharge status: Home / Self Care | Payer: Self-pay | Source: Ambulatory Visit | Attending: Internal Medicine

## 2018-01-07 ENCOUNTER — Encounter (HOSPITAL_COMMUNITY): Payer: Self-pay | Admitting: *Deleted

## 2018-01-07 ENCOUNTER — Ambulatory Visit (HOSPITAL_COMMUNITY)
Admission: RE | Admit: 2018-01-07 | Discharge: 2018-01-07 | Disposition: A | Discharge status: Home / Self Care | Payer: 59 | Source: Ambulatory Visit | Attending: Internal Medicine | Admitting: Internal Medicine

## 2018-01-07 ENCOUNTER — Other Ambulatory Visit: Payer: Self-pay

## 2018-01-07 DIAGNOSIS — D123 Benign neoplasm of transverse colon: Secondary | ICD-10-CM | POA: Diagnosis not present

## 2018-01-07 DIAGNOSIS — Z79899 Other long term (current) drug therapy: Secondary | ICD-10-CM | POA: Diagnosis not present

## 2018-01-07 DIAGNOSIS — Z1211 Encounter for screening for malignant neoplasm of colon: Secondary | ICD-10-CM | POA: Insufficient documentation

## 2018-01-07 DIAGNOSIS — D122 Benign neoplasm of ascending colon: Secondary | ICD-10-CM | POA: Insufficient documentation

## 2018-01-07 HISTORY — PX: COLONOSCOPY: SHX5424

## 2018-01-07 HISTORY — PX: POLYPECTOMY: SHX5525

## 2018-01-07 SURGERY — COLONOSCOPY
Anesthesia: Moderate Sedation

## 2018-01-07 MED ORDER — SODIUM CHLORIDE 0.9 % IV SOLN
INTRAVENOUS | Status: DC
  Administered 2018-01-07: 1000 mL via INTRAVENOUS

## 2018-01-07 MED ORDER — MIDAZOLAM HCL 5 MG/5ML IJ SOLN
INTRAMUSCULAR | Status: AC
  Filled 2018-01-07: qty 10

## 2018-01-07 MED ORDER — STERILE WATER FOR IRRIGATION IR SOLN
Status: DC | PRN
  Administered 2018-01-07: 1.5 mL

## 2018-01-07 MED ORDER — ONDANSETRON HCL 4 MG/2ML IJ SOLN
INTRAMUSCULAR | Status: AC
  Filled 2018-01-07: qty 2

## 2018-01-07 MED ORDER — MEPERIDINE HCL 100 MG/ML IJ SOLN
INTRAMUSCULAR | Status: DC | PRN
  Administered 2018-01-07: 25 mg

## 2018-01-07 MED ORDER — MIDAZOLAM HCL 5 MG/5ML IJ SOLN
INTRAMUSCULAR | Status: DC | PRN
  Administered 2018-01-07: 2 mg via INTRAVENOUS
  Administered 2018-01-07 (×3): 1 mg via INTRAVENOUS

## 2018-01-07 MED ORDER — MEPERIDINE HCL 50 MG/ML IJ SOLN
INTRAMUSCULAR | Status: AC
  Filled 2018-01-07: qty 1

## 2018-01-07 NOTE — H&P (Signed)
@LOGO @   Primary Care Physician:  Lemmie Evens, MD Primary Gastroenterologist:  Dr. Gala Romney  Pre-Procedure History & Physical: HPI:  Natasha Oneal is a 52 y.o. female is here for a screening colonoscopy.  No bowel symptoms.  No prior colonoscopy.  No family history of colon cancer.  Past Medical History:  Diagnosis Date  . Breast cyst 03/11/2013   Left breast at 3 0'clock will get F/U US in march  . Elevated BP 03/16/2014  . Positive occult stool blood test 03/16/2014    Past Surgical History:  Procedure Laterality Date  . bladder stem dilitation     under general anesthesia    Prior to Admission medications   Medication Sig Start Date End Date Taking? Authorizing Provider  acetaminophen (TYLENOL) 325 MG tablet Take 650 mg by mouth every 6 (six) hours as needed.   Yes [provider]  Cholecalciferol (VITAMIN D3) 25 MCG (1000 UT) CAPS Take 1,000 Units by mouth daily.   Yes [provider]    Allergies as of 11/24/2017  . (No Known Allergies)    Family History  Problem Relation Age of Onset  . Diabetes Father   . Parkinson's disease Father   . Diabetes Paternal Aunt     Social History   Socioeconomic History  . Marital status: Married    Spouse name: Not on file  . Number of children: Not on file  . Years of education: Not on file  . Highest education level: Not on file  Occupational History  . Not on file  Social Needs  . Financial resource strain: Not on file  . Food insecurity:    Worry: Not on file    Inability: Not on file  . Transportation needs:    Medical: Not on file    Non-medical: Not on file  Tobacco Use  . Smoking status: Never Smoker  . Smokeless tobacco: Never Used  Substance and Sexual Activity  . Alcohol use: No  . Drug use: No  . Sexual activity: Yes    Birth control/protection: None, Other-see comments    Comment: vasectomy  Lifestyle  . Physical activity:    Days per week: Not on file    Minutes per session:  Not on file  . Stress: Not on file  Relationships  . Social connections:    Talks on phone: Not on file    Gets together: Not on file    Attends religious service: Not on file    Active member of club or organization: Not on file    Attends meetings of clubs or organizations: Not on file    Relationship status: Not on file  . Intimate partner violence:    Fear of current or ex partner: Not on file    Emotionally abused: Not on file    Physically abused: Not on file    Forced sexual activity: Not on file  Other Topics Concern  . Not on file  Social History Narrative  . Not on file    Review of Systems: See HPI, otherwise negative ROS  Physical Exam: BP 121/64   Pulse 73   Temp 97.8 F (36.6 C) (Oral)   Resp 12   Ht 5\' 3"  (1.6 m)   Wt 66.2 kg   LMP 08/28/2016   SpO2 99%   BMI 25.86 kg/m  General:   Alert,  Well-developed, well-nourished, pleasant and cooperative in NAD Lungs:  Clear throughout to auscultation.   No wheezes, crackles, or rhonchi.  No acute distress. Heart:  Regular rate and rhythm; no murmurs, clicks, rubs,  or gallops. Abdomen:  Soft, nontender and nondistended. No masses, hepatosplenomegaly or hernias noted. Normal bowel sounds, without guarding, and without rebound.   Impression/Plan: Natasha Oneal is now here to undergo a screening colonoscopy.   First ever average risk screening examination.  Risks, benefits, limitations, imponderables and alternatives regarding colonoscopy have been reviewed with the patient. Questions have been answered. All parties agreeable.     Notice:  This dictation was prepared with Dragon dictation along with smaller phrase technology. Any transcriptional errors that result from this process are unintentional and may not be corrected upon review.

## 2018-01-07 NOTE — Discharge Instructions (Signed)
Colon Polyps °Polyps are tissue growths inside the body. Polyps can grow in many places, including the large intestine (colon). A polyp may be a round bump or a mushroom-shaped growth. You could have one polyp or several. °Most colon polyps are noncancerous (benign). However, some colon polyps can become cancerous over time. °What are the causes? °The exact cause of colon polyps is not known. °What increases the risk? °This condition is more likely to develop in people who: °· Have a family history of colon cancer or colon polyps. °· Are older than 50 or older than 45 if they are African American. °· Have inflammatory bowel disease, such as ulcerative colitis or Crohn disease. °· Are overweight. °· Smoke cigarettes. °· Do not get enough exercise. °· Drink too much alcohol. °· Eat a diet that is: °? High in fat and red meat. °? Low in fiber. °· Had childhood cancer that was treated with abdominal radiation. ° °What are the signs or symptoms? °Most polyps do not cause symptoms. If you have symptoms, they may include: °· Blood coming from your rectum when having a bowel movement. °· Blood in your stool. The stool may look dark red or black. °· A change in bowel habits, such as constipation or diarrhea. ° °How is this diagnosed? °This condition is diagnosed with a colonoscopy. This is a procedure that uses a lighted, flexible scope to look at the inside of your colon. °How is this treated? °Treatment for this condition involves removing any polyps that are found. Those polyps will then be tested for cancer. If cancer is found, your health care provider will talk to you about options for colon cancer treatment. °Follow these instructions at home: °Diet °· Eat plenty of fiber, such as fruits, vegetables, and whole grains. °· Eat foods that are high in calcium and vitamin D, such as milk, cheese, yogurt, eggs, liver, fish, and broccoli. °· Limit foods high in fat, red meats, and processed meats, such as hot dogs, sausage,  bacon, and lunch meats. °· Maintain a healthy weight, or lose weight if recommended by your health care provider. °General instructions °· Do not smoke cigarettes. °· Do not drink alcohol excessively. °· Keep all follow-up visits as told by your health care provider. This is important. This includes keeping regularly scheduled colonoscopies. Talk to your health care provider about when you need a colonoscopy. °· Exercise every day or as told by your health care provider. °Contact a health care provider if: °· You have new or worsening bleeding during a bowel movement. °· You have new or increased blood in your stool. °· You have a change in bowel habits. °· You unexpectedly lose weight. °This information is not intended to replace advice given to you by your health care provider. Make sure you discuss any questions you have with your health care provider. °Document Released: 10/18/2003 Document Revised: 06/29/2015 Document Reviewed: 12/12/2014 °Elsevier Interactive Patient Education © 2018 [employer] ° °Colonoscopy °Discharge Instructions ° °Read the instructions outlined below and refer to this sheet in the next few weeks. These discharge instructions provide you with general information on caring for yourself after you leave the hospital. Your doctor may also give you specific instructions. While your treatment has been planned according to the most current medical practices available, unavoidable complications occasionally occur. If you have any problems or questions after discharge, call Dr. Rourk at [phone]. °ACTIVITY °· You may resume your regular activity, but move at a slower pace for the next 24   hours.  °· Take frequent rest periods for the next 24 hours.  °· Walking will help get rid of the air and reduce the bloated feeling in your belly (abdomen).  °· No driving for 24 hours (because of the medicine (anesthesia) used during the test).   °· Do not sign any important legal documents or operate any  machinery for 24 hours (because of the anesthesia used during the test).  °NUTRITION °· Drink plenty of fluids.  °· You may resume your normal diet as instructed by your doctor.  °· Begin with a light meal and progress to your normal diet. Heavy or fried foods are harder to digest and may make you feel sick to your stomach (nauseated).  °· Avoid alcoholic beverages for 24 hours or as instructed.  °MEDICATIONS °· You may resume your normal medications unless your doctor tells you otherwise.  °WHAT YOU CAN EXPECT TODAY °· Some feelings of bloating in the abdomen.  °· Passage of more gas than usual.  °· Spotting of blood in your stool or on the toilet paper.  °IF YOU HAD POLYPS REMOVED DURING THE COLONOSCOPY: °· No aspirin products for 7 days or as instructed.  °· No alcohol for 7 days or as instructed.  °· Eat a soft diet for the next 24 hours.  °FINDING OUT THE RESULTS OF YOUR TEST °Not all test results are available during your visit. If your test results are not back during the visit, make an appointment with your caregiver to find out the results. Do not assume everything is normal if you have not heard from your caregiver or the medical facility. It is important for you to follow up on all of your test results.  °SEEK IMMEDIATE MEDICAL ATTENTION IF: °· You have more than a spotting of blood in your stool.  °· Your belly is swollen (abdominal distention).  °· You are nauseated or vomiting.  °· You have a temperature over 101.  °· You have abdominal pain or discomfort that is severe or gets worse throughout the day.  ° ° ° °Colon polyp information provided ° °Further recommendations to follow pending review of pathology report °

## 2018-01-08 NOTE — Op Note (Signed)
[facility] Patient Name: Natasha Oneal Procedure Date: 01/07/2018 12:38 PM MRN: [id number] Date of Birth: 09/03/1965 Attending MD: Norvel Richards , MD CSN: [id number] Age: 52 Admit Type: Outpatient Procedure:                Colonoscopy Indications:              Screening for colorectal malignant neoplasm Providers:                Norvel Richards, MD, Lurline Del, RN, Gerome Sam, RN Referring MD:              Medicines:                Midazolam 5 mg IV, Meperidine 25 mg IV, Ondansetron                            4 mg IV Complications:            No immediate complications. Estimated Blood Loss:     Estimated blood loss was minimal. Procedure:                Pre-Anesthesia Assessment:                           - Prior to the procedure, a History and Physical                            was performed, and patient medications and                            allergies were reviewed. The patient's tolerance of                            previous anesthesia was also reviewed. The risks                            and benefits of the procedure and the sedation                            options and risks were discussed with the patient.                            All questions were answered, and informed consent                            was obtained. Prior Anticoagulants: The patient has                            taken no previous anticoagulant or antiplatelet                            agents. ASA Grade Assessment: II - A patient with  mild systemic disease. After reviewing the risks                            and benefits, the patient was deemed in                            satisfactory condition to undergo the procedure.                           After obtaining informed consent, the colonoscope                            was passed under direct vision. Throughout the                            procedure, the  patient's blood pressure, pulse, and                            oxygen saturations were monitored continuously. The                            CF-HQ190L ([id number]) scope was introduced through                            the anus and advanced to the the cecum, identified                            by appendiceal orifice and ileocecal valve. The                            ileocecal valve, appendiceal orifice, and rectum                            were photographed. The entire colon was well                            visualized. Scope In: 12:52:40 PM Scope Out: 1:09:07 PM Scope Withdrawal Time: 0 hours 10 minutes 10 seconds  Total Procedure Duration: 0 hours 16 minutes 27 seconds  Findings:      The perianal and digital rectal examinations were normal.      A 5 mm polyp was found in the splenic flexure. The polyp was sessile.       The polyp was removed with a cold snare. Resection and retrieval were       complete. Estimated blood loss was minimal.      A 12 mm polyp was found in the ascending colon. The polyp was sessile.       The polyp was removed with a hot snare. Resection and retrieval were       complete. Estimated blood loss: none.      The exam was otherwise without abnormality on direct and retroflexion       views. Impression:               - One 5 mm polyp at the splenic flexure, removed  with a cold snare. Resected and retrieved.                           - One 12 mm polyp in the ascending colon, removed                            with a hot snare. Resected and retrieved.                           - The examination was otherwise normal on direct                            and retroflexion views. Moderate Sedation:      Moderate (conscious) sedation was administered by the endoscopy nurse       and supervised by the endoscopist. The following parameters were       monitored: oxygen saturation, heart rate, blood pressure, respiratory       rate, EKG,  adequacy of pulmonary ventilation, and response to care.       Total physician intraservice time was 22 minutes. Recommendation:           - Patient has a contact number available for                            emergencies. The signs and symptoms of potential                            delayed complications were discussed with the                            patient. Return to normal activities tomorrow.                            Written discharge instructions were provided to the                            patient.                           - Advance diet as tolerated.                           - Continue present medications.                           - Repeat colonoscopy date to be determined after                            pending pathology results are reviewed for                            surveillance based on pathology results.                           - Return to GI office (date not yet determined). Procedure Code(s):        ---  Professional ---                           [phone], Colonoscopy, flexible; with removal of                            tumor(s), polyp(s), or other lesion(s) by snare                            technique                           G0500, Moderate sedation services provided by the                            same physician or other qualified health care                            professional performing a gastrointestinal                            endoscopic service that sedation supports,                            requiring the presence of an independent trained                            observer to assist in the monitoring of the                            patient's level of consciousness and physiological                            status; initial 15 minutes of intra-service time;                            patient age 76 years or older (additional time may                            be reported with [phone], as appropriate) Diagnosis Code(s):        ---  Professional ---                           Z12.11, Encounter for screening for malignant                            neoplasm of colon                           D12.3, Benign neoplasm of transverse colon (hepatic                            flexure or splenic flexure)                           D12.2, Benign neoplasm of  ascending colon CPT copyright 2018 American Medical Association. All rights reserved. The codes documented in this report are preliminary and upon coder review may  be revised to meet current compliance requirements. Cristopher Estimable. Rourk, MD Norvel Richards, MD 01/07/2018 1:19:26 PM This report has been signed electronically. Number of Addenda: 0

## 2018-01-11 ENCOUNTER — Encounter: Payer: Self-pay | Admitting: Internal Medicine

## 2018-01-13 ENCOUNTER — Encounter (HOSPITAL_COMMUNITY): Payer: Self-pay | Admitting: Internal Medicine

## 2018-09-08 DIAGNOSIS — Z20828 Contact with and (suspected) exposure to other viral communicable diseases: Secondary | ICD-10-CM | POA: Diagnosis not present

## 2018-09-18 ENCOUNTER — Other Ambulatory Visit (HOSPITAL_COMMUNITY): Payer: Self-pay | Admitting: Adult Health

## 2018-09-18 DIAGNOSIS — Z1231 Encounter for screening mammogram for malignant neoplasm of breast: Secondary | ICD-10-CM

## 2018-10-28 ENCOUNTER — Ambulatory Visit (HOSPITAL_COMMUNITY)
Admission: RE | Admit: 2018-10-28 | Discharge: 2018-10-28 | Disposition: A | Discharge status: Home / Self Care | Payer: 59 | Source: Ambulatory Visit | Attending: Adult Health | Admitting: Adult Health

## 2018-10-28 ENCOUNTER — Encounter (HOSPITAL_COMMUNITY): Payer: Self-pay

## 2018-10-28 ENCOUNTER — Ambulatory Visit (HOSPITAL_COMMUNITY): Payer: 59

## 2018-10-28 ENCOUNTER — Other Ambulatory Visit: Payer: Self-pay

## 2018-10-28 DIAGNOSIS — Z1231 Encounter for screening mammogram for malignant neoplasm of breast: Secondary | ICD-10-CM | POA: Insufficient documentation

## 2019-04-19 DIAGNOSIS — H5213 Myopia, bilateral: Secondary | ICD-10-CM | POA: Diagnosis not present

## 2019-07-29 ENCOUNTER — Encounter: Payer: Self-pay | Admitting: Adult Health

## 2019-07-29 ENCOUNTER — Other Ambulatory Visit (HOSPITAL_COMMUNITY)
Admission: RE | Admit: 2019-07-29 | Discharge: 2019-07-29 | Disposition: A | Discharge status: Home / Self Care | Payer: 59 | Source: Ambulatory Visit | Attending: Adult Health | Admitting: Adult Health

## 2019-07-29 ENCOUNTER — Ambulatory Visit (INDEPENDENT_AMBULATORY_CARE_PROVIDER_SITE_OTHER): Payer: 59 | Admitting: Adult Health

## 2019-07-29 VITALS — BP 134/78 | HR 68 | Ht 63.0 in | Wt 161.0 lb

## 2019-07-29 DIAGNOSIS — Z1212 Encounter for screening for malignant neoplasm of rectum: Secondary | ICD-10-CM | POA: Diagnosis not present

## 2019-07-29 DIAGNOSIS — Z01419 Encounter for gynecological examination (general) (routine) without abnormal findings: Secondary | ICD-10-CM | POA: Diagnosis not present

## 2019-07-29 DIAGNOSIS — Z1211 Encounter for screening for malignant neoplasm of colon: Secondary | ICD-10-CM

## 2019-07-29 LAB — HEMOCCULT GUIAC POC 1CARD (OFFICE): Fecal Occult Blood, POC: NEGATIVE

## 2019-07-29 NOTE — Addendum Note (Signed)
Addended by: Janece Canterbury on: 07/29/2019 02:57 PM   Modules accepted: Orders

## 2019-07-29 NOTE — Progress Notes (Signed)
Patient ID: Natasha Oneal, female   DOB: [birth year], 54 y.o.   MRN: [id number] History of Present Illness: Natasha Oneal is a 54 year old white female, married, PM in for a well woman gyn exam and pap. She works at the [employer]. She had COVID in November and has  Had her vaccines.  PCP is Dr Karie Kirks.   Current Medications, Allergies, Past Medical History, Past Surgical History, Family History and Social History were reviewed in [employer] record.     Review of Systems: Patient denies any headaches, hearing loss, fatigue, blurred vision, shortness of breath, chest pain, abdominal pain, problems with bowel movements, urination, or intercourse. No joint pain or mood swings.    Physical Exam:BP 134/78 (BP Location: Right Arm, Patient Position: Sitting, Cuff Size: Normal)   Pulse 68   Ht 5\' 3"  (1.6 m)   Wt 161 lb (73 kg)   LMP 08/28/2016   BMI 28.52 kg/m  General:  Well developed, well nourished, no acute distress Skin:  Warm and dry Neck:  Midline trachea, normal thyroid, good ROM, no lymphadenopathy Lungs; Clear to auscultation bilaterally Breast:  No dominant palpable mass, retraction, or nipple discharge Cardiovascular: Regular rate and rhythm Abdomen:  Soft, non tender, no hepatosplenomegaly Pelvic:  External genitalia is normal in appearance, no lesions.  The vagina is normal in appearance. Urethra has no lesions or masses. The cervix is bulbous.  Uterus is felt to be normal size, shape, and contour.  No adnexal masses or tenderness noted.Bladder is non tender, no masses felt. Rectal: Good sphincter tone, no polyps, or hemorrhoids felt.  Hemoccult negative. Extremities/musculoskeletal:  No swelling or varicosities noted, no clubbing or cyanosis Psych:  No mood changes, alert and cooperative,seems happy AA 0  Fall risk is low PHQ 9 score is 0. Examination chaperoned by Celene Squibb LPN  Impression and Plan: 1. Encounter for gynecological examination  with Papanicolaou smear of cervix Pap sent Physical in 1 year Pap in 3 if normal Mammogram yearly Call when wants fasting labs   2. Screening for colorectal cancer Colonoscopy per GI had polyps with last colonoscopy 2019, so probably 5 years

## 2019-08-02 LAB — CYTOLOGY - PAP
Comment: NEGATIVE
Diagnosis: NEGATIVE
High risk HPV: NEGATIVE

## 2019-09-20 ENCOUNTER — Other Ambulatory Visit (HOSPITAL_COMMUNITY): Payer: Self-pay | Admitting: Adult Health

## 2019-09-20 DIAGNOSIS — Z1231 Encounter for screening mammogram for malignant neoplasm of breast: Secondary | ICD-10-CM

## 2019-11-01 ENCOUNTER — Other Ambulatory Visit: Payer: Self-pay

## 2019-11-01 ENCOUNTER — Ambulatory Visit (HOSPITAL_COMMUNITY)
Admission: RE | Admit: 2019-11-01 | Discharge: 2019-11-01 | Disposition: A | Discharge status: Home / Self Care | Payer: 59 | Source: Ambulatory Visit | Attending: Adult Health | Admitting: Adult Health

## 2019-11-01 DIAGNOSIS — Z1231 Encounter for screening mammogram for malignant neoplasm of breast: Secondary | ICD-10-CM | POA: Diagnosis not present

## 2020-08-03 ENCOUNTER — Encounter: Payer: Self-pay | Admitting: Adult Health

## 2020-08-03 ENCOUNTER — Other Ambulatory Visit: Payer: Self-pay

## 2020-08-03 ENCOUNTER — Ambulatory Visit (INDEPENDENT_AMBULATORY_CARE_PROVIDER_SITE_OTHER): Payer: 59 | Admitting: Adult Health

## 2020-08-03 VITALS — BP 130/76 | HR 80 | Ht 63.0 in | Wt 157.0 lb

## 2020-08-03 DIAGNOSIS — Z1211 Encounter for screening for malignant neoplasm of colon: Secondary | ICD-10-CM

## 2020-08-03 DIAGNOSIS — Z01419 Encounter for gynecological examination (general) (routine) without abnormal findings: Secondary | ICD-10-CM | POA: Diagnosis not present

## 2020-08-03 LAB — HEMOCCULT GUIAC POC 1CARD (OFFICE): Fecal Occult Blood, POC: NEGATIVE

## 2020-08-03 NOTE — Progress Notes (Signed)
Patient ID: Natasha Oneal, female   DOB: 07-Mar-1965, 55 y.o.   MRN: [id number] History of Present Illness: Natasha Oneal os a 55 year old white female,married,PM in for well woman gyn exam. Lab Results  Component Value Date   DIAGPAP  07/29/2019    - Negative for intraepithelial lesion or malignancy (NILM)   HPV NOT DETECTED 09/09/2016   [place] Negative 07/29/2019    PCP is Dr Karie Kirks.  Current Medications, Allergies, Past Medical History, Past Surgical History, Family History and Social History were reviewed in [employer] record.     Review of Systems: Patient denies any headaches, hearing loss, fatigue, blurred vision, shortness of breath, chest pain, abdominal pain, problems with bowel movements, urination, or intercourse. No joint pain or mood swings.  She had COVID 12/2018 and spotted some after then none since.    Physical Exam:BP 130/76 (BP Location: Left Arm, Cuff Size: Normal)   Pulse 80   Ht 5\' 3"  (1.6 m)   Wt 157 lb (71.2 kg)   LMP 08/28/2016   BMI 27.81 kg/m   General:  Well developed, well nourished, no acute distress Skin:  Warm and dry Neck:  Midline trachea, normal thyroid, good ROM, no lymphadenopathy Lungs; Clear to auscultation bilaterally Breast:  No dominant palpable mass, retraction, or nipple discharge Cardiovascular: Regular rate and rhythm Abdomen:  Soft, non tender, no hepatosplenomegaly Pelvic:  External genitalia is normal in appearance, no lesions.  The vagina is normal in appearance. Urethra has no lesions or masses. The cervix is smooth.  Uterus is felt to be normal size, shape, and contour.  No adnexal masses or tenderness noted.Bladder is non tender, no masses felt. Rectal: Good sphincter tone, no polyps, or hemorrhoids felt.  Hemoccult negative. Extremities/musculoskeletal:  No swelling or varicosities noted, no clubbing or cyanosis Psych:  No mood changes, alert and cooperative,seems happy AA is 0  Fall risk is  low Depression screen [facility] 2/9 08/03/2020 07/29/2019 10/20/2017  Decreased Interest 0 - 0  Down, Depressed, Hopeless 0 0 0  PHQ - 2 Score 0 0 0  Altered sleeping 0 0 0  Tired, decreased energy 0 0 0  Change in appetite 0 0 0  Feeling bad or failure about yourself  0 0 0  Trouble concentrating 0 0 0  Moving slowly or fidgety/restless 0 0 0  Suicidal thoughts 0 0 0  PHQ-9 Score 0 0 0    GAD 7 : Generalized Anxiety Score 08/03/2020 07/29/2019  Nervous, Anxious, on Edge 0 0  Control/stop worrying 0 0  Worry too much - different things 0 0  Trouble relaxing 0 0  Restless 0 0  Easily annoyed or irritable 0 0  Afraid - awful might happen 0 0  Total GAD 7 Score 0 0      Upstream - 08/03/20 1432       Pregnancy Intention Screening   Does the patient want to become pregnant in the next year? No    Does the patient's partner want to become pregnant in the next year? No    Would the patient like to discuss contraceptive options today? No      Contraception Wrap Up   Current Method Vasectomy    End Method Vasectomy    Contraception Counseling Provided No            Examination chaperoned by Tish RN  Impression and Plan: 1. Encounter for well woman exam with routine gynecological exam Physical in 1 year Pap  in 2024 Mammogram yearly  Colonoscopy 2024  2. Encounter for screening fecal occult blood testing

## 2020-09-19 ENCOUNTER — Other Ambulatory Visit (HOSPITAL_COMMUNITY): Payer: Self-pay | Admitting: Adult Health

## 2020-09-19 DIAGNOSIS — Z1231 Encounter for screening mammogram for malignant neoplasm of breast: Secondary | ICD-10-CM

## 2020-11-02 ENCOUNTER — Ambulatory Visit (HOSPITAL_COMMUNITY)
Admission: RE | Admit: 2020-11-02 | Discharge: 2020-11-02 | Disposition: A | Discharge status: Home / Self Care | Payer: 59 | Source: Ambulatory Visit | Attending: Adult Health | Admitting: Adult Health

## 2020-11-02 ENCOUNTER — Other Ambulatory Visit: Payer: Self-pay

## 2020-11-02 DIAGNOSIS — Z1231 Encounter for screening mammogram for malignant neoplasm of breast: Secondary | ICD-10-CM | POA: Insufficient documentation

## 2020-12-04 ENCOUNTER — Encounter: Payer: Self-pay | Admitting: *Deleted

## 2020-12-15 ENCOUNTER — Encounter: Payer: Self-pay | Admitting: Internal Medicine

## 2021-01-13 ENCOUNTER — Telehealth: Payer: 59 | Admitting: Nurse Practitioner

## 2021-01-13 DIAGNOSIS — J069 Acute upper respiratory infection, unspecified: Secondary | ICD-10-CM

## 2021-01-13 MED ORDER — FLUTICASONE PROPIONATE 50 MCG/ACT NA SUSP: 2.0000 | Freq: Every day | NASAL | 6 refills | Status: DC

## 2021-01-13 MED ORDER — PROMETHAZINE-DM 6.25-15 MG/5ML PO SYRP: 5.0000 mL | ORAL_SOLUTION | Freq: Four times a day (QID) | ORAL | 0 refills | Status: DC | PRN

## 2021-01-13 MED ORDER — PREDNISONE 10 MG PO TABS: 10.0000 mg | ORAL_TABLET | Freq: Every day | ORAL | 0 refills | Status: AC

## 2021-01-13 NOTE — Progress Notes (Signed)

## 2021-01-13 NOTE — Progress Notes (Signed)
I have spent 5 minutes in review of e-visit questionnaire, review and updating patient chart, medical decision making and response to patient.  ° °Edwyna Dangerfield W Macio Kissoon, NP ° °  °

## 2021-01-17 ENCOUNTER — Other Ambulatory Visit: Payer: Self-pay

## 2021-01-17 ENCOUNTER — Ambulatory Visit (INDEPENDENT_AMBULATORY_CARE_PROVIDER_SITE_OTHER): Payer: Self-pay | Admitting: *Deleted

## 2021-01-17 VITALS — Ht 63.0 in | Wt 151.0 lb

## 2021-01-17 DIAGNOSIS — Z86010 Personal history of colonic polyps: Secondary | ICD-10-CM

## 2021-01-17 NOTE — Progress Notes (Addendum)
Gastroenterology Pre-Procedure Review  Request Date: 01/17/2021 Requesting Physician: 3 year recall, Last TCS done 01/07/2018 by Dr. Gala Romney, tubular adenoma, sessile serrated polyp  PATIENT REVIEW QUESTIONS: The patient responded to the following health history questions as indicated:    1. Diabetes Melitis: no 2. Joint replacements in the past 12 months: no 3. Major health problems in the past 3 months: no 4. Has an artificial valve or MVP: no 5. Has a defibrillator: no 6. Has been advised in past to take antibiotics in advance of a procedure like teeth cleaning: no 7. Family history of colon cancer: no  8. Alcohol Use: no 9. Illicit drug Use: no 10. History of sleep apnea: no  11. History of coronary artery or other vascular stents placed within the last 12 months: no 12. History of any prior anesthesia complications: no 13. Body mass index is 26.75 kg/m.    MEDICATIONS & ALLERGIES:    Patient reports the following regarding taking any blood thinners:   Plavix? no Aspirin? no Coumadin? no Brilinta? no Xarelto? no Eliquis? no Pradaxa? no Savaysa? no Effient? no  Patient confirms/reports the following medications:  Current Outpatient Medications  Medication Sig Dispense Refill   acetaminophen (TYLENOL) 325 MG tablet Take 650 mg by mouth as needed.     fluticasone (FLONASE) 50 MCG/ACT nasal spray Place 2 sprays into both nostrils daily. (Patient taking differently: Place 2 sprays into both nostrils as needed.) 16 g 6   predniSONE (DELTASONE) 10 MG tablet Take 1 tablet (10 mg total) by mouth daily with breakfast for 6 days. 6 tablet 0   promethazine-dextromethorphan (PROMETHAZINE-DM) 6.25-15 MG/5ML syrup Take 5 mLs by mouth 4 (four) times daily as needed for cough. (Patient taking differently: Take 5 mLs by mouth as needed for cough.) 240 mL 0   PFIZER-BIONTECH COVID-19 VACC 30 MCG/0.3ML injection  (Patient not taking: Reported on 01/17/2021)     No current  facility-administered medications for this visit.    Patient confirms/reports the following allergies:  No Known Allergies  No orders of the defined types were placed in this encounter.   AUTHORIZATION INFORMATION Primary Insurance: [employer] [place],  [place] #: [id number],  Group #: [id number] Pre-Cert / Josem Kaufmann required: No, not required per [place] Pre-Cert / Auth #: REF#: [id number]  SCHEDULE INFORMATION: Procedure has been scheduled as follows:  Date: 02/07/2021, Time: 1:00 Location: [facility] with Dr. Gala Romney  This Gastroenterology Pre-Precedure Review Form is being routed to the following provider(s): Neil Crouch, PA-C

## 2021-01-23 NOTE — Progress Notes (Signed)
Ok to schedule conscious sedation. ASA II.  °

## 2021-01-24 NOTE — Progress Notes (Signed)
Lmom for pt to call me back. 

## 2021-01-25 ENCOUNTER — Other Ambulatory Visit: Payer: Self-pay | Admitting: *Deleted

## 2021-01-25 ENCOUNTER — Telehealth: Payer: Self-pay | Admitting: Internal Medicine

## 2021-01-25 ENCOUNTER — Other Ambulatory Visit (HOSPITAL_COMMUNITY): Payer: Self-pay

## 2021-01-25 ENCOUNTER — Encounter: Payer: Self-pay | Admitting: *Deleted

## 2021-01-25 MED ORDER — CLENPIQ 10-3.5-12 MG-GM -GM/160ML PO SOLN
1.0000 | Freq: Once | ORAL | 0 refills | Status: AC
  Filled 2021-01-25: qty 320, 1d supply, fill #0

## 2021-01-25 NOTE — Progress Notes (Signed)
Spoke to pt.  Scheduled procedure for 02/07/2021 with arrival at 12:00.  Reviewed prep instructions with pt.  Pt requested instructions to be faxed to [phone].  She is going to call me if she doesn't receive them.  Faxed accordingly.

## 2021-01-25 NOTE — Progress Notes (Signed)
Lmom for pt to call me back. 

## 2021-01-25 NOTE — Addendum Note (Signed)
Addended by: Metro Kung on: 01/25/2021 11:23 AM   Modules accepted: Orders

## 2021-01-25 NOTE — Telephone Encounter (Signed)
PLEASE CALL PATIENT BACK [phone]

## 2021-01-25 NOTE — Telephone Encounter (Signed)
Tried to call pt back.  Had to leave a voice message for her.

## 2021-01-31 ENCOUNTER — Other Ambulatory Visit (HOSPITAL_COMMUNITY): Payer: Self-pay

## 2021-02-05 ENCOUNTER — Other Ambulatory Visit (HOSPITAL_COMMUNITY): Payer: Self-pay

## 2021-02-06 ENCOUNTER — Telehealth: Payer: Self-pay | Admitting: *Deleted

## 2021-02-06 NOTE — Telephone Encounter (Signed)
LMOVM to call back to see if she can move procedure time up for tomorrow

## 2021-02-06 NOTE — Telephone Encounter (Signed)
Patient returned call. Procedure time moved to 9:30am. Aware she can have clear liquids until midnight and then npo midnight

## 2021-02-07 ENCOUNTER — Encounter (HOSPITAL_COMMUNITY)
Admission: RE | Disposition: A | Discharge status: Home / Self Care | Payer: Self-pay | Source: Home / Self Care | Attending: Internal Medicine

## 2021-02-07 ENCOUNTER — Encounter (HOSPITAL_COMMUNITY): Payer: Self-pay | Admitting: Internal Medicine

## 2021-02-07 ENCOUNTER — Other Ambulatory Visit: Payer: Self-pay

## 2021-02-07 ENCOUNTER — Ambulatory Visit (HOSPITAL_COMMUNITY)
Admission: RE | Admit: 2021-02-07 | Discharge: 2021-02-07 | Disposition: A | Discharge status: Home / Self Care | Payer: 59 | Attending: Internal Medicine | Admitting: Internal Medicine

## 2021-02-07 DIAGNOSIS — K648 Other hemorrhoids: Secondary | ICD-10-CM | POA: Insufficient documentation

## 2021-02-07 DIAGNOSIS — Z86010 Personal history of colonic polyps: Secondary | ICD-10-CM | POA: Diagnosis not present

## 2021-02-07 DIAGNOSIS — Z1211 Encounter for screening for malignant neoplasm of colon: Secondary | ICD-10-CM | POA: Insufficient documentation

## 2021-02-07 HISTORY — PX: COLONOSCOPY: SHX5424

## 2021-02-07 SURGERY — COLONOSCOPY
Anesthesia: Moderate Sedation

## 2021-02-07 MED ORDER — MEPERIDINE HCL 50 MG/ML IJ SOLN
INTRAMUSCULAR | Status: AC
  Filled 2021-02-07: qty 1

## 2021-02-07 MED ORDER — MEPERIDINE HCL 100 MG/ML IJ SOLN
INTRAMUSCULAR | Status: DC | PRN
  Administered 2021-02-07: 10 mg via INTRAVENOUS
  Administered 2021-02-07: 40 mg via INTRAVENOUS

## 2021-02-07 MED ORDER — SODIUM CHLORIDE 0.9 % IV SOLN
INTRAVENOUS | Status: DC
  Administered 2021-02-07: 1000 mL via INTRAVENOUS

## 2021-02-07 MED ORDER — ONDANSETRON HCL 4 MG/2ML IJ SOLN
INTRAMUSCULAR | Status: DC | PRN
  Administered 2021-02-07: 4 mg via INTRAVENOUS

## 2021-02-07 MED ORDER — MIDAZOLAM HCL 5 MG/5ML IJ SOLN
INTRAMUSCULAR | Status: AC
  Filled 2021-02-07: qty 10

## 2021-02-07 MED ORDER — STERILE WATER FOR IRRIGATION IR SOLN
Status: DC | PRN
  Administered 2021-02-07: 50 mL

## 2021-02-07 MED ORDER — ONDANSETRON HCL 4 MG/2ML IJ SOLN
INTRAMUSCULAR | Status: AC
  Filled 2021-02-07: qty 2

## 2021-02-07 MED ORDER — MIDAZOLAM HCL 5 MG/5ML IJ SOLN
INTRAMUSCULAR | Status: DC | PRN
  Administered 2021-02-07 (×2): 2 mg via INTRAVENOUS

## 2021-02-07 NOTE — Discharge Instructions (Signed)
°  Colonoscopy Discharge Instructions  Read the instructions outlined below and refer to this sheet in the next few weeks. These discharge instructions provide you with general information on caring for yourself after you leave the hospital. Your doctor may also give you specific instructions. While your treatment has been planned according to the most current medical practices available, unavoidable complications occasionally occur. If you have any problems or questions after discharge, call Dr. Gala Romney at [phone]. ACTIVITY You may resume your regular activity, but move at a slower pace for the next 24 hours.  Take frequent rest periods for the next 24 hours.  Walking will help get rid of the air and reduce the bloated feeling in your belly (abdomen).  No driving for 24 hours (because of the medicine (anesthesia) used during the test).   Do not sign any important legal documents or operate any machinery for 24 hours (because of the anesthesia used during the test).  NUTRITION Drink plenty of fluids.  You may resume your normal diet as instructed by your doctor.  Begin with a light meal and progress to your normal diet. Heavy or fried foods are harder to digest and may make you feel sick to your stomach (nauseated).  Avoid alcoholic beverages for 24 hours or as instructed.  MEDICATIONS You may resume your normal medications unless your doctor tells you otherwise.  WHAT YOU CAN EXPECT TODAY Some feelings of bloating in the abdomen.  Passage of more gas than usual.  Spotting of blood in your stool or on the toilet paper.  IF YOU HAD POLYPS REMOVED DURING THE COLONOSCOPY: No aspirin products for 7 days or as instructed.  No alcohol for 7 days or as instructed.  Eat a soft diet for the next 24 hours.  FINDING OUT THE RESULTS OF YOUR TEST Not all test results are available during your visit. If your test results are not back during the visit, make an appointment with your caregiver to find out the  results. Do not assume everything is normal if you have not heard from your caregiver or the medical facility. It is important for you to follow up on all of your test results.  SEEK IMMEDIATE MEDICAL ATTENTION IF: You have more than a spotting of blood in your stool.  Your belly is swollen (abdominal distention).  You are nauseated or vomiting.  You have a temperature over 101.  You have abdominal pain or discomfort that is severe or gets worse throughout the day.     No polyps found today which is great news!  It is recommended you return for a repeat colonoscopy in 5 years  At patient request, I called Shannon at [phone]-discussed findings and recommendations

## 2021-02-07 NOTE — Op Note (Signed)
[facility] Patient Name: Natasha Oneal Procedure Date: 02/07/2021 9:08 AM MRN: [id number] Date of Birth: 08-12-1965 Attending MD: Norvel Richards , MD CSN: [id number] Age: 56 Admit Type: Outpatient Procedure:                Colonoscopy Indications:              High risk colon cancer surveillance: Personal                            history of colonic polyps Providers:                Norvel Richards, MD, Lurline Del, RN, Wynonia Musty Tech, Technician Referring MD:              Medicines:                Midazolam 4 mg IV, Meperidine 50 mg IV Complications:            No immediate complications. Estimated Blood Loss:     Estimated blood loss: none. Procedure:                Pre-Anesthesia Assessment:                           - Prior to the procedure, a History and Physical                            was performed, and patient medications and                            allergies were reviewed. The patient's tolerance of                            previous anesthesia was also reviewed. The risks                            and benefits of the procedure and the sedation                            options and risks were discussed with the patient.                            All questions were answered, and informed consent                            was obtained. Prior Anticoagulants: The patient has                            taken no previous anticoagulant or antiplatelet                            agents. ASA Grade Assessment: II - A patient with  mild systemic disease. After reviewing the risks                            and benefits, the patient was deemed in                            satisfactory condition to undergo the procedure.                           After obtaining informed consent, the colonoscope                            was passed under direct vision. Throughout the                             procedure, the patient's blood pressure, pulse, and                            oxygen saturations were monitored continuously. The                            [id number] scope was introduced through the                            anus and advanced to the the cecum, identified by                            appendiceal orifice and ileocecal valve. The                            colonoscopy was performed without difficulty. The                            patient tolerated the procedure well. The quality                            of the bowel preparation was adequate. Scope In: 9:48:28 AM Scope Out: 9:58:23 AM Scope Withdrawal Time: 0 hours 6 minutes 50 seconds  Total Procedure Duration: 0 hours 9 minutes 55 seconds  Findings:      The perianal and digital rectal examinations were normal.      The colon (entire examined portion) appeared normal.      The retroflexed view of the distal rectum and anal verge was normal and       showed minimal internal hemorrhoids. Impression:               - The entire examined colon is normal.                           - The distal rectum and anal verge are normal on                            retroflexion view (minimal internal hemorrhoids).                           -  No specimens collected. Moderate Sedation:      Moderate (conscious) sedation was administered by the endoscopy nurse       and supervised by the endoscopist. The following parameters were       monitored: oxygen saturation, heart rate, blood pressure, respiratory       rate, EKG, adequacy of pulmonary ventilation, and response to care.       Total physician intraservice time was 17 minutes. Recommendation:           - Patient has a contact number available for                            emergencies. The signs and symptoms of potential                            delayed complications were discussed with the                            patient. Return to normal activities tomorrow.                             Written discharge instructions were provided to the                            patient.                           - Advance diet as tolerated.                           - Continue present medications.                           - Repeat colonoscopy in 5 years for surveillance.                           - Return to GI office (date not yet determined). Procedure Code(s):        --- Professional ---                           [phone], Colonoscopy, flexible; diagnostic, including                            collection of specimen(s) by brushing or washing,                            when performed (separate procedure)                           G0500, Moderate sedation services provided by the                            same physician or other qualified health care                            professional performing a gastrointestinal  endoscopic service that sedation supports,                            requiring the presence of an independent trained                            observer to assist in the monitoring of the                            patient's level of consciousness and physiological                            status; initial 15 minutes of intra-service time;                            patient age 55 years or older (additional time may                            be reported with [phone], as appropriate) Diagnosis Code(s):        --- Professional ---                           Z86.010, Personal history of colonic polyps CPT copyright 2019 American Medical Association. All rights reserved. The codes documented in this report are preliminary and upon coder review may  be revised to meet current compliance requirements. Cristopher Estimable. Babygirl Trager, MD Norvel Richards, MD 02/07/2021 10:30:51 AM This report has been signed electronically. Number of Addenda: 0

## 2021-02-07 NOTE — H&P (Signed)
@LOGO @   Primary Care Physician:  Lemmie Evens, MD Primary Gastroenterologist:  Dr. Gala Romney  Pre-Procedure History & Physical: HPI:  Natasha Oneal is a 56 y.o. female here for Surveillance colonoscopy.  112 mm adenoma and 1 5 mm adenoma removed 2019.  No bowel symptoms.  Here for surveillance examination.  Past Medical History:  Diagnosis Date   Breast cyst 03/11/2013   Left breast at 3 0'clock will get F/U US in march   Elevated BP 03/16/2014   Positive occult stool blood test 03/16/2014    Past Surgical History:  Procedure Laterality Date   bladder stem dilitation     under general anesthesia   COLONOSCOPY N/A 01/07/2018   Procedure: COLONOSCOPY;  Surgeon: Daneil Dolin, MD;  Location: [facility];  Service: Endoscopy;  Laterality: N/A;  1:00   POLYPECTOMY  01/07/2018   Procedure: POLYPECTOMY;  Surgeon: Daneil Dolin, MD;  Location: [facility];  Service: Endoscopy;;  (colon)    Prior to Admission medications   Medication Sig Start Date End Date Taking? Authorizing Provider  acetaminophen (TYLENOL) 500 MG tablet Take 500 mg by mouth every 8 (eight) hours as needed for moderate pain.   Yes [provider]  fluticasone (FLONASE) 50 MCG/ACT nasal spray Place 2 sprays into both nostrils daily. 01/13/21  Yes Gildardo Pounds, NP  promethazine-dextromethorphan (PROMETHAZINE-DM) 6.25-15 MG/5ML syrup Take 5 mLs by mouth 4 (four) times daily as needed for cough. 01/13/21  Yes Gildardo Pounds, NP    Allergies as of 01/25/2021   (No Known Allergies)    Family History  Problem Relation Age of Onset   Diabetes Father    Parkinson's disease Father    Diabetes Paternal Aunt     Social History   Socioeconomic History   Marital status: Married    Spouse name: Not on file   Number of children: Not on file   Years of education: Not on file   Highest education level: Not on file  Occupational History   Not on file  Tobacco Use   Smoking status: Never    Smokeless tobacco: Never  Vaping Use   Vaping Use: Never used  Substance and Sexual Activity   Alcohol use: No   Drug use: No   Sexual activity: Yes    Birth control/protection: None, Other-see comments    Comment: vasectomy  Other Topics Concern   Not on file  Social History Narrative   Not on file   Social Determinants of Health   Financial Resource Strain: Low Risk    Difficulty of Paying Living Expenses: Not hard at all  Food Insecurity: No Food Insecurity   Worried About Charity fundraiser in the Last Year: Never true   [place] in the Last Year: Never true  Transportation Needs: No Transportation Needs   Lack of Transportation (Medical): No   Lack of Transportation (Non-Medical): No  Physical Activity: Sufficiently Active   Days of Exercise per Week: 6 days   Minutes of Exercise per Session: 40 min  Stress: No Stress Concern Present   Feeling of Stress : Only a little  Social Connections: Moderately Integrated   Frequency of Communication with Friends and Family: Three times a week   Frequency of Social Gatherings with Friends and Family: Once a week   Attends Religious Services: More than 4 times per year   Active Member of [employer] or Organizations: No   Attends Archivist Meetings: Never  Marital Status: Married  Human resources officer Violence: Not At Risk   Fear of Current or Ex-Partner: No   Emotionally Abused: No   Physically Abused: No   Sexually Abused: No    Review of Systems: See HPI, otherwise negative ROS  Physical Exam: BP 138/84    Pulse 82    Temp 97.7 F (36.5 C) (Oral)    Resp 14    Ht 5\' 3"  (1.6 m)    Wt 68.5 kg    LMP 08/28/2016    SpO2 99%    BMI 26.75 kg/m  General:   Alert,  Well-developed, well-nourished, pleasant and cooperative in NAD Neck:  Supple; no masses or thyromegaly. No significant cervical adenopathy. Lungs:  Clear throughout to auscultation.   No wheezes, crackles, or rhonchi. No acute distress. Heart:  Regular  rate and rhythm; no murmurs, clicks, rubs,  or gallops. Abdomen: Non-distended, normal bowel sounds.  Soft and nontender without appreciable mass or hepatosplenomegaly.  Pulses:  Normal pulses noted. Extremities:  Without clubbing or edema.  Impression/Plan:    56 year old lady here for surveillance colonoscopy.  History of colonic adenoma.    I have offered the patient a surveillance colonoscopy per plan. The risks, benefits, limitations, alternatives and imponderables have been reviewed with the patient. Questions have been answered. All parties are agreeable.       Notice: This dictation was prepared with Dragon dictation along with smaller phrase technology. Any transcriptional errors that result from this process are unintentional and may not be corrected upon review.

## 2021-02-13 ENCOUNTER — Encounter (HOSPITAL_COMMUNITY): Payer: Self-pay | Admitting: Internal Medicine

## 2021-08-08 ENCOUNTER — Encounter: Payer: Self-pay | Admitting: Adult Health

## 2021-08-08 ENCOUNTER — Ambulatory Visit (INDEPENDENT_AMBULATORY_CARE_PROVIDER_SITE_OTHER): Payer: 59 | Admitting: Adult Health

## 2021-08-08 VITALS — BP 138/85 | HR 80 | Ht 63.25 in | Wt 152.5 lb

## 2021-08-08 DIAGNOSIS — Z780 Asymptomatic menopausal state: Secondary | ICD-10-CM

## 2021-08-08 DIAGNOSIS — Z1211 Encounter for screening for malignant neoplasm of colon: Secondary | ICD-10-CM | POA: Diagnosis not present

## 2021-08-08 DIAGNOSIS — Z01419 Encounter for gynecological examination (general) (routine) without abnormal findings: Secondary | ICD-10-CM | POA: Diagnosis not present

## 2021-08-08 LAB — HEMOCCULT GUIAC POC 1CARD (OFFICE): Fecal Occult Blood, POC: NEGATIVE

## 2021-08-08 NOTE — Progress Notes (Signed)
Patient ID: Natasha Oneal, female   DOB: [birth year], 56 y.o.   MRN: [id number] History of Present Illness: Natasha Oneal is a 56 year old white female,married, PM in for a well woman gyn exam. She is still working and is active.  Lab Results  Component Value Date   DIAGPAP  07/29/2019    - Negative for intraepithelial lesion or malignancy (NILM)   HPV NOT DETECTED 09/09/2016   [place] Negative 07/29/2019    PCP is Dr Karie Kirks.  Current Medications, Allergies, Past Medical History, Past Surgical History, Family History and Social History were reviewed in [employer] record.     Review of Systems: Patient denies any headaches, hearing loss, fatigue, blurred vision, shortness of breath, chest pain, abdominal pain, problems with bowel movements, urination, or intercourse.(More urgency than used to). No joint pain or mood swings.  Denies any vaginal bleeding.   Physical Exam:BP 138/85 (BP Location: Left Arm, Patient Position: Sitting, Cuff Size: Normal)   Pulse 80   Ht 5' 3.25" (1.607 m)   Wt 152 lb 8 oz (69.2 kg)   LMP 08/19/2016   BMI 26.80 kg/m   General:  Well developed, well nourished, no acute distress Skin:  Warm and dry Neck:  Midline trachea, normal thyroid, good ROM, no lymphadenopathy Lungs; Clear to auscultation bilaterally Breast:  No dominant palpable mass, retraction, or nipple discharge Cardiovascular: Regular rate and rhythm Abdomen:  Soft, non tender, no hepatosplenomegaly Pelvic:  External genitalia is normal in appearance, no lesions.  The vagina is pale with loss of rugae.Urethra has no lesions or masses. The cervix is smooth.  Uterus is felt to be normal size, shape, and contour.  No adnexal masses or tenderness noted.Bladder is non tender, no masses felt. Rectal: Good sphincter tone, no polyps, or hemorrhoids felt.  Hemoccult negative. Extremities/musculoskeletal:  No swelling or varicosities noted, no clubbing or cyanosis Psych:  No mood  changes, alert and cooperative,seems happy AA is 0 Fall risk is low    08/08/2021    2:47 PM 08/03/2020    2:35 PM 07/29/2019    1:39 PM  Depression screen PHQ 2/9  Decreased Interest 0 0   Down, Depressed, Hopeless 0 0 0  PHQ - 2 Score 0 0 0  Altered sleeping 0 0 0  Tired, decreased energy 0 0 0  Change in appetite 0 0 0  Feeling bad or failure about yourself  0 0 0  Trouble concentrating 0 0 0  Moving slowly or fidgety/restless 0 0 0  Suicidal thoughts 0 0 0  PHQ-9 Score 0 0 0       08/08/2021    2:48 PM 08/03/2020    2:36 PM 07/29/2019    1:40 PM  GAD 7 : Generalized Anxiety Score  Nervous, Anxious, on Edge 0 0 0  Control/stop worrying 0 0 0  Worry too much - different things 0 0 0  Trouble relaxing 0 0 0  Restless 0 0 0  Easily annoyed or irritable 0 0 0  Afraid - awful might happen 0 0 0  Total GAD 7 Score 0 0 0      Upstream - 08/08/21 1453       Pregnancy Intention Screening   Does the patient want to become pregnant in the next year? No    Does the patient's partner want to become pregnant in the next year? No    Would the patient like to discuss contraceptive options today? No  Contraception Wrap Up   Current Method Vasectomy    End Method Vasectomy            Examination chaperoned by Levy Pupa LPN  Impression and Plan: 1. Encounter for well woman exam with routine gynecological exam Pap and physical in 1 year Will check labs fasting  - CBC - Comprehensive metabolic panel - TSH - Lipid panel Mammogram yearly Colonoscopy per GI, in 5 years   2. Encounter for screening fecal occult blood testing Hemoccult was negative  - POCT occult blood stool  3. Post-menopausal -denies any vaginal bleeding

## 2021-08-30 DIAGNOSIS — Z01419 Encounter for gynecological examination (general) (routine) without abnormal findings: Secondary | ICD-10-CM | POA: Diagnosis not present

## 2021-08-31 LAB — COMPREHENSIVE METABOLIC PANEL
ALT: 25 IU/L (ref 0–32)
AST: 27 IU/L (ref 0–40)
Albumin/Globulin Ratio: 1.8 (ref 1.2–2.2)
Albumin: 4.6 g/dL (ref 3.8–4.9)
Alkaline Phosphatase: 60 IU/L (ref 44–121)
BUN/Creatinine Ratio: 22 (ref 9–23)
BUN: 17 mg/dL (ref 6–24)
Bilirubin Total: 0.5 mg/dL (ref 0.0–1.2)
CO2: 24 mmol/L (ref 20–29)
Calcium: 9.5 mg/dL (ref 8.7–10.2)
Chloride: 104 mmol/L (ref 96–106)
Creatinine, Ser: 0.76 mg/dL (ref 0.57–1.00)
Globulin, Total: 2.5 g/dL (ref 1.5–4.5)
Glucose: 93 mg/dL (ref 70–99)
Potassium: 4.1 mmol/L (ref 3.5–5.2)
Sodium: 142 mmol/L (ref 134–144)
Total Protein: 7.1 g/dL (ref 6.0–8.5)
eGFR: 93 mL/min/{1.73_m2} (ref >59)

## 2021-08-31 LAB — CBC
Hematocrit: 40.8 % (ref 34.0–46.6)
Hemoglobin: 13.8 g/dL (ref 11.1–15.9)
MCH: 30.7 pg (ref 26.6–33.0)
MCHC: 33.8 g/dL (ref 31.5–35.7)
MCV: 91 fL (ref 79–97)
Platelets: 167 10*3/uL (ref 150–450)
RBC: 4.5 x10E6/uL (ref 3.77–5.28)
RDW: 12.6 % (ref 11.7–15.4)
WBC: 5.7 10*3/uL (ref 3.4–10.8)

## 2021-08-31 LAB — LIPID PANEL
Chol/HDL Ratio: 3 ratio (ref 0.0–4.4)
Cholesterol, Total: 242 mg/dL — ABNORMAL HIGH (ref 100–199)
HDL: 81 mg/dL (ref >39)
LDL Chol Calc (NIH): 147 mg/dL — ABNORMAL HIGH (ref 0–99)
Triglycerides: 84 mg/dL (ref 0–149)
VLDL Cholesterol Cal: 14 mg/dL (ref 5–40)

## 2021-08-31 LAB — TSH: TSH: 2.49 u[IU]/mL (ref 0.450–4.500)

## 2021-09-21 ENCOUNTER — Other Ambulatory Visit (HOSPITAL_COMMUNITY): Payer: Self-pay | Admitting: Adult Health

## 2021-09-21 DIAGNOSIS — Z1231 Encounter for screening mammogram for malignant neoplasm of breast: Secondary | ICD-10-CM

## 2021-11-05 ENCOUNTER — Ambulatory Visit (HOSPITAL_COMMUNITY)
Admission: RE | Admit: 2021-11-05 | Discharge: 2021-11-05 | Disposition: A | Discharge status: Home / Self Care | Payer: 59 | Source: Ambulatory Visit | Attending: Adult Health | Admitting: Adult Health

## 2021-11-05 DIAGNOSIS — Z1231 Encounter for screening mammogram for malignant neoplasm of breast: Secondary | ICD-10-CM | POA: Diagnosis not present

## 2022-01-08 DIAGNOSIS — H5213 Myopia, bilateral: Secondary | ICD-10-CM | POA: Diagnosis not present

## 2022-08-14 ENCOUNTER — Ambulatory Visit: Payer: 59 | Admitting: Family Medicine

## 2022-08-14 ENCOUNTER — Encounter: Payer: Self-pay | Admitting: Family Medicine

## 2022-08-14 VITALS — BP 114/73 | HR 85 | Ht 63.0 in | Wt 152.0 lb

## 2022-08-14 DIAGNOSIS — Z0001 Encounter for general adult medical examination with abnormal findings: Secondary | ICD-10-CM

## 2022-08-14 DIAGNOSIS — E782 Mixed hyperlipidemia: Secondary | ICD-10-CM

## 2022-08-14 DIAGNOSIS — Z114 Encounter for screening for human immunodeficiency virus [HIV]: Secondary | ICD-10-CM

## 2022-08-14 DIAGNOSIS — Z131 Encounter for screening for diabetes mellitus: Secondary | ICD-10-CM

## 2022-08-14 DIAGNOSIS — Z1159 Encounter for screening for other viral diseases: Secondary | ICD-10-CM

## 2022-08-14 DIAGNOSIS — Z1329 Encounter for screening for other suspected endocrine disorder: Secondary | ICD-10-CM

## 2022-08-14 NOTE — Patient Instructions (Addendum)
        Great to see you today.   If labs were collected, we will inform you of lab results once received either by echart message or telephone call.   - echart message- for normal results that have been seen by the patient already.   - telephone call: abnormal results or if patient has not viewed results in their echart.   - Follow up with your primary health provider if any health concerns arises. - If symptoms worsen please contact your primary care provider and/or visit the emergency department.  

## 2022-08-14 NOTE — Progress Notes (Deleted)
   Complete physical exam  Patient: Natasha Oneal   DOB: [birth year]   57 y.o. Female  MRN: [id number]  Subjective:    Chief Complaint  Patient presents with   Establish Care    STEFANIE HODGENS is a 57 y.o. female who presents today for a complete physical exam. She reports consuming a general diet. Home exercise routine includes strength training 3 times and week, 3 1/2 miles once a week, running 3 times 12 miles. Gym/ health club routine includes  . She generally feels fairly well. She reports sleeping well. She does not have additional problems to discuss today.    Most recent fall risk assessment:    08/14/2022    2:19 PM  Fall Risk   Falls in the past year? 0  Number falls in past yr: 0  Injury with Fall? 0  Risk for fall due to : No Fall Risks  Follow up Falls evaluation completed     Most recent depression screenings:    08/14/2022    2:19 PM 08/08/2021    2:47 PM  PHQ 2/9 Scores  PHQ - 2 Score 0 0  PHQ- 9 Score 0 0    Vision:Within last year and Dental: No current dental problems and Receives regular dental care  Patient Care Team: Del Newman Nip, Tenna Child, FNP as PCP - General (Family Medicine) Corbin Ade, MD as Consulting Physician (Gastroenterology)   Outpatient Medications Prior to Visit  Medication Sig   acetaminophen (TYLENOL) 500 MG tablet Take 500 mg by mouth every 8 (eight) hours as needed for moderate pain.   No facility-administered medications prior to visit.    ROS     Objective:    BP 114/73   Pulse 85   Ht 5\' 3"  (1.6 m)   Wt 152 lb (68.9 kg)   LMP 08/19/2016   SpO2 96%   BMI 26.93 kg/m  BP Readings from Last 3 Encounters:  08/14/22 114/73  08/08/21 138/85  02/07/21 134/79      Physical Exam   No results found for any visits on 08/14/22.    Assessment & Plan:    Routine Health Maintenance and Physical Exam  Immunization History  Administered Date(s) Administered   Influenza-Unspecified 11/08/2012    Health  Maintenance  Topic Date Due   HIV Screening  Never done   Hepatitis C Screening  Never done   DTaP/Tdap/Td (1 - Tdap) Never done   Zoster Vaccines- Shingrix (1 of 2) Never done   PAP SMEAR-Modifier  07/29/2022   COVID-19 Vaccine (1) 10/14/2022 (Originally 07/03/1970)   MAMMOGRAM  11/06/2022   Colonoscopy  02/08/2031   HPV VACCINES  Aged Out    Discussed health benefits of physical activity, and encouraged her to engage in regular exercise appropriate for her age and condition.  Mixed hyperlipidemia  Screening for diabetes mellitus  Screening for thyroid disorder  Screening for HIV (human immunodeficiency virus)  Need for hepatitis C screening test    Return in about 1 month (around 09/14/2022) for Pap smear.     Cruzita Lederer Newman Nip, FNP

## 2022-08-14 NOTE — Assessment & Plan Note (Signed)
Physical exam done, labs ordered  Updated screening and health maintenance  Exercise and nutrition counseling BMI  26.93 Discussed lifestyle modifications follow diet low in saturated fat, reduce dietary salt intake, avoid fatty foods.

## 2022-08-14 NOTE — Progress Notes (Signed)
Complete physical exam  Patient: Natasha Oneal   DOB: 1965-11-21   57 y.o. Female  MRN: [id number]  Subjective:    Chief Complaint  Patient presents with   Establish Care    Natasha Oneal is a 57 y.o. female who presents today for a complete physical exam. She reports consuming a general diet. Gym/ health club routine includes strength training 3 times a week. Walks once a week 3 1/2 miles and running 12 miles 3 times a week.  She generally feels well. She reports sleeping well. She does not have additional problems to discuss today.    Most recent fall risk assessment:    08/14/2022    2:19 PM  Fall Risk   Falls in the past year? 0  Number falls in past yr: 0  Injury with Fall? 0  Risk for fall due to : No Fall Risks  Follow up Falls evaluation completed     Most recent depression screenings:    08/14/2022    2:19 PM 08/08/2021    2:47 PM  PHQ 2/9 Scores  PHQ - 2 Score 0 0  PHQ- 9 Score 0 0    Vision:Within last year and Dental: No current dental problems and Receives regular dental care  Patient Care Team: Del Newman Nip, Tenna Child, FNP as PCP - General (Family Medicine) Corbin Ade, MD as Consulting Physician (Gastroenterology)   Outpatient Medications Prior to Visit  Medication Sig   acetaminophen (TYLENOL) 500 MG tablet Take 500 mg by mouth every 8 (eight) hours as needed for moderate pain.   No facility-administered medications prior to visit.    Review of Systems  Constitutional:  Negative for chills and fever.  HENT:  Negative for tinnitus.   Eyes:  Negative for blurred vision.  Respiratory:  Negative for shortness of breath.   Cardiovascular:  Negative for chest pain.  Gastrointestinal:  Negative for abdominal pain.  Genitourinary:  Negative for dysuria.  Musculoskeletal:  Negative for myalgias.  Skin:  Negative for rash.  Neurological:  Negative for dizziness and headaches.       Objective:    BP 114/73   Pulse 85   Ht 5\' 3"  (1.6  m)   Wt 152 lb (68.9 kg)   LMP 08/19/2016   SpO2 96%   BMI 26.93 kg/m  BP Readings from Last 3 Encounters:  08/14/22 114/73  08/08/21 138/85  02/07/21 134/79      Physical Exam Vitals reviewed.  Constitutional:      General: She is not in acute distress.    Appearance: Normal appearance. She is not ill-appearing, toxic-appearing or diaphoretic.  HENT:     Head: Normocephalic.     Right Ear: Tympanic membrane normal.     Left Ear: Tympanic membrane normal.     Nose: Nose normal.     Mouth/Throat:     Mouth: Mucous membranes are moist.  Eyes:     General:        Right eye: No discharge.        Left eye: No discharge.     Conjunctiva/sclera: Conjunctivae normal.     Pupils: Pupils are equal, round, and reactive to light.  Cardiovascular:     Rate and Rhythm: Normal rate.     Pulses: Normal pulses.     Heart sounds: Normal heart sounds.  Pulmonary:     Effort: Pulmonary effort is normal. No respiratory distress.     Breath sounds: Normal breath sounds.  Abdominal:     General: Bowel sounds are normal.     Palpations: Abdomen is soft.     Tenderness: There is no abdominal tenderness. There is no right CVA tenderness, left CVA tenderness or guarding.  Musculoskeletal:        General: Normal range of motion.     Cervical back: Normal range of motion.  Skin:    General: Skin is warm and dry.     Capillary Refill: Capillary refill takes less than 2 seconds.  Neurological:     General: No focal deficit present.     Mental Status: She is alert and oriented to person, place, and time.     Coordination: Coordination normal.     Gait: Gait normal.  Psychiatric:        Mood and Affect: Mood normal.        Behavior: Behavior normal.      No results found for any visits on 08/14/22.    Assessment & Plan:    Routine Health Maintenance and Physical Exam  Immunization History  Administered Date(s) Administered   Influenza-Unspecified 11/08/2012    Health Maintenance   Topic Date Due   HIV Screening  Never done   Hepatitis C Screening  Never done   DTaP/Tdap/Td (1 - Tdap) Never done   Zoster Vaccines- Shingrix (1 of 2) Never done   PAP SMEAR-Modifier  07/29/2022   COVID-19 Vaccine (1) 10/14/2022 (Originally 07/03/1970)   MAMMOGRAM  11/06/2022   Colonoscopy  02/08/2031   HPV VACCINES  Aged Out    Discussed health benefits of physical activity, and encouraged her to engage in regular exercise appropriate for her age and condition.  Mixed hyperlipidemia -     CBC with Differential/Platelet -     CMP14+EGFR -     Lipid panel  Screening for diabetes mellitus -     Hemoglobin A1c -     Microalbumin / creatinine urine ratio  Screening for thyroid disorder -     TSH + free T4  Screening for HIV (human immunodeficiency virus) -     HIV Antibody (routine testing w rflx)  Need for hepatitis C screening test -     Hepatitis C antibody  Encounter for routine adult physical exam with abnormal findings Assessment & Plan: Physical exam done, labs ordered  Updated screening and health maintenance  Exercise and nutrition counseling BMI  26.93 Discussed lifestyle modifications follow diet low in saturated fat, reduce dietary salt intake, avoid fatty foods.     Return in about 1 year (around 08/14/2023), or if symptoms worsen or fail to improve, for routine labs, Annual Physical.     Cruzita Lederer Newman Nip, FNP

## 2022-08-14 NOTE — Progress Notes (Deleted)
   New Patient Office Visit   Subjective   Patient ID: Natasha Oneal, female    DOB: [birth year]  Age: 57 y.o. MRN: [id number]  CC:  Chief Complaint  Patient presents with   Establish Care    HPI Natasha Oneal 57 year old female, presents to establish care. She  has a past medical history of Breast cyst (03/11/2013), Elevated BP (03/16/2014), Hyperlipidemia, and Positive occult stool blood test (03/16/2014).  HPI    Outpatient Encounter Medications as of 08/14/2022  Medication Sig   acetaminophen (TYLENOL) 500 MG tablet Take 500 mg by mouth every 8 (eight) hours as needed for moderate pain.   No facility-administered encounter medications on file as of 08/14/2022.    Past Surgical History:  Procedure Laterality Date   bladder stem dilitation     under general anesthesia   COLONOSCOPY N/A 01/07/2018   Procedure: COLONOSCOPY;  Surgeon: Corbin Ade, MD;  [facility];  Service: Endoscopy;  Laterality: N/A;  1:00   COLONOSCOPY N/A 02/07/2021   Procedure: COLONOSCOPY;  Surgeon: Corbin Ade, MD;  [facility];  Service: Endoscopy;  Laterality: N/A;  1:00   POLYPECTOMY  01/07/2018   Procedure: POLYPECTOMY;  Surgeon: Corbin Ade, MD;  [facility];  Service: Endoscopy;;  (colon)    ROS    Objective    BP 114/73   Pulse 85   Ht 5\' 3"  (1.6 m)   Wt 152 lb (68.9 kg)   LMP 08/19/2016   SpO2 96%   BMI 26.93 kg/m   Physical Exam    Assessment & Plan:  There are no diagnoses linked to this encounter.  No follow-ups on file.   Cruzita Lederer Newman Nip, FNP

## 2022-08-20 DIAGNOSIS — Z1329 Encounter for screening for other suspected endocrine disorder: Secondary | ICD-10-CM | POA: Diagnosis not present

## 2022-08-20 DIAGNOSIS — Z131 Encounter for screening for diabetes mellitus: Secondary | ICD-10-CM | POA: Diagnosis not present

## 2022-08-20 DIAGNOSIS — Z1159 Encounter for screening for other viral diseases: Secondary | ICD-10-CM | POA: Diagnosis not present

## 2022-08-20 DIAGNOSIS — E782 Mixed hyperlipidemia: Secondary | ICD-10-CM | POA: Diagnosis not present

## 2022-08-20 DIAGNOSIS — Z114 Encounter for screening for human immunodeficiency virus [HIV]: Secondary | ICD-10-CM | POA: Diagnosis not present

## 2022-08-22 LAB — TSH+FREE T4
Free T4: 1.12 ng/dL (ref 0.82–1.77)
TSH: 2.48 u[IU]/mL (ref 0.450–4.500)

## 2022-08-22 LAB — CBC WITH DIFFERENTIAL/PLATELET
Basophils Absolute: 0 10*3/uL (ref 0.0–0.2)
Basos: 1 %
EOS (ABSOLUTE): 0 10*3/uL (ref 0.0–0.4)
Eos: 1 %
Hematocrit: 41.1 % (ref 34.0–46.6)
Hemoglobin: 13.3 g/dL (ref 11.1–15.9)
Immature Grans (Abs): 0 10*3/uL (ref 0.0–0.1)
Immature Granulocytes: 0 %
Lymphocytes Absolute: 1.5 10*3/uL (ref 0.7–3.1)
Lymphs: 33 %
MCH: 29.6 pg (ref 26.6–33.0)
MCHC: 32.4 g/dL (ref 31.5–35.7)
MCV: 91 fL (ref 79–97)
Monocytes Absolute: 0.3 10*3/uL (ref 0.1–0.9)
Monocytes: 7 %
Neutrophils Absolute: 2.6 10*3/uL (ref 1.4–7.0)
Neutrophils: 58 %
Platelets: 179 10*3/uL (ref 150–450)
RBC: 4.5 x10E6/uL (ref 3.77–5.28)
RDW: 12.4 % (ref 11.7–15.4)
WBC: 4.4 10*3/uL (ref 3.4–10.8)

## 2022-08-22 LAB — CMP14+EGFR
ALT: 16 IU/L (ref 0–32)
AST: 25 IU/L (ref 0–40)
Albumin: 4.4 g/dL (ref 3.8–4.9)
Alkaline Phosphatase: 61 IU/L (ref 44–121)
BUN/Creatinine Ratio: 19 (ref 9–23)
BUN: 14 mg/dL (ref 6–24)
Bilirubin Total: 0.4 mg/dL (ref 0.0–1.2)
CO2: 23 mmol/L (ref 20–29)
Calcium: 9.4 mg/dL (ref 8.7–10.2)
Chloride: 104 mmol/L (ref 96–106)
Creatinine, Ser: 0.75 mg/dL (ref 0.57–1.00)
Globulin, Total: 2.4 g/dL (ref 1.5–4.5)
Glucose: 95 mg/dL (ref 70–99)
Potassium: 4.1 mmol/L (ref 3.5–5.2)
Sodium: 140 mmol/L (ref 134–144)
Total Protein: 6.8 g/dL (ref 6.0–8.5)
eGFR: 94 mL/min/{1.73_m2} (ref >59)

## 2022-08-22 LAB — MICROALBUMIN / CREATININE URINE RATIO
Creatinine, Urine: 203.8 mg/dL
Microalb/Creat Ratio: 4 mg/g creat (ref 0–29)
Microalbumin, Urine: 9.1 ug/mL

## 2022-08-22 LAB — HEMOGLOBIN A1C
Est. average glucose Bld gHb Est-mCnc: 114 mg/dL
Hgb A1c MFr Bld: 5.6 % (ref 4.8–5.6)

## 2022-08-22 LAB — LIPID PANEL
Chol/HDL Ratio: 2.9 ratio (ref 0.0–4.4)
Cholesterol, Total: 222 mg/dL — ABNORMAL HIGH (ref 100–199)
HDL: 77 mg/dL (ref >39)
LDL Chol Calc (NIH): 130 mg/dL — ABNORMAL HIGH (ref 0–99)
Triglycerides: 84 mg/dL (ref 0–149)
VLDL Cholesterol Cal: 15 mg/dL (ref 5–40)

## 2022-08-22 LAB — HEPATITIS C ANTIBODY: Hep C Virus Ab: NONREACTIVE

## 2022-08-22 LAB — HIV ANTIBODY (ROUTINE TESTING W REFLEX): HIV Screen 4th Generation wRfx: NONREACTIVE

## 2022-09-24 ENCOUNTER — Encounter: Payer: Self-pay | Admitting: Adult Health

## 2022-09-24 ENCOUNTER — Ambulatory Visit (INDEPENDENT_AMBULATORY_CARE_PROVIDER_SITE_OTHER): Payer: 59 | Admitting: Adult Health

## 2022-09-24 ENCOUNTER — Other Ambulatory Visit (HOSPITAL_COMMUNITY)
Admission: RE | Admit: 2022-09-24 | Discharge: 2022-09-24 | Disposition: A | Discharge status: Home / Self Care | Payer: 59 | Source: Ambulatory Visit | Attending: Adult Health | Admitting: Adult Health

## 2022-09-24 VITALS — BP 136/83 | HR 79 | Ht 63.0 in | Wt 152.0 lb

## 2022-09-24 DIAGNOSIS — Z780 Asymptomatic menopausal state: Secondary | ICD-10-CM | POA: Diagnosis not present

## 2022-09-24 DIAGNOSIS — Z1211 Encounter for screening for malignant neoplasm of colon: Secondary | ICD-10-CM | POA: Diagnosis not present

## 2022-09-24 DIAGNOSIS — Z01419 Encounter for gynecological examination (general) (routine) without abnormal findings: Secondary | ICD-10-CM | POA: Diagnosis not present

## 2022-09-24 LAB — HEMOCCULT GUIAC POC 1CARD (OFFICE): Fecal Occult Blood, POC: NEGATIVE

## 2022-09-24 NOTE — Progress Notes (Signed)
Patient ID: Natasha Oneal, female   DOB: 1965-09-03, 57 y.o.   MRN: [id number] History of Present Illness: Natasha Oneal is a 57 year old white female, married, PM in for a well woman gyn exam and pap.  PCP is I Polanco, NP   Current Medications, Allergies, Past Medical History, Past Surgical History, Family History and Social History were reviewed in [employer] record.     Review of Systems: Patient denies any headaches, hearing loss, fatigue, blurred vision, shortness of breath, chest pain, abdominal pain, problems with bowel movements, urination, or intercourse. No joint pain or mood swings.  Denies any vaginal bleeding  May wipe blood if hemorrhoid flaring   Physical Exam:BP 136/83 (BP Location: Left Arm, Patient Position: Sitting, Cuff Size: Normal)   Pulse 79   Ht 5\' 3"  (1.6 m)   Wt 152 lb (68.9 kg)   LMP 08/19/2016   BMI 26.93 kg/m   General:  Well developed, well nourished, no acute distress Skin:  Warm and dry Neck:  Midline trachea, normal thyroid, good ROM, no lymphadenopathy Lungs; Clear to auscultation bilaterally Breast:  No dominant palpable mass, retraction, or nipple discharge Cardiovascular: Regular rate and rhythm Abdomen:  Soft, non tender, no hepatosplenomegaly Pelvic:  External genitalia is normal in appearance, no lesions.  The vagina is normal in appearance. Urethra has no lesions or masses. The cervix is smooth, pap with HR HPV genotyping performed, spotted with EC brush.  Uterus is felt to be normal size, shape, and contour.  No adnexal masses or tenderness noted.Bladder is non tender, no masses felt. Rectal: Good sphincter tone, no polyps, + hemorrhoids felt.  Hemoccult negative. Extremities/musculoskeletal:  No swelling or varicosities noted, no clubbing or cyanosis Psych:  No mood changes, alert and cooperative,seems happy AA is 0 Fall risk is low    09/24/2022    2:26 PM 08/14/2022    2:19 PM 08/08/2021    2:47 PM  Depression  screen PHQ 2/9  Decreased Interest 0 0 0  Down, Depressed, Hopeless 0 0 0  PHQ - 2 Score 0 0 0  Altered sleeping 0 0 0  Tired, decreased energy 0 0 0  Change in appetite 0 0 0  Feeling bad or failure about yourself  0 0 0  Trouble concentrating 0 0 0  Moving slowly or fidgety/restless 0 0 0  Suicidal thoughts 0 0 0  PHQ-9 Score 0 0 0  Difficult doing work/chores  Not difficult at all        09/24/2022    2:26 PM 08/14/2022    2:19 PM 08/08/2021    2:48 PM 08/03/2020    2:36 PM  GAD 7 : Generalized Anxiety Score  Nervous, Anxious, on Edge 0 0 0 0  Control/stop worrying 0 0 0 0  Worry too much - different things 0 0 0 0  Trouble relaxing 0 0 0 0  Restless 0 0 0 0  Easily annoyed or irritable 0 0 0 0  Afraid - awful might happen 0 0 0 0  Total GAD 7 Score 0 0 0 0  Anxiety Difficulty  Not difficult at all      Upstream - 09/24/22 1430       Pregnancy Intention Screening   Does the patient want to become pregnant in the next year? N/A    Does the patient's partner want to become pregnant in the next year? N/A    Would the patient like to discuss contraceptive options today?  N/A      Contraception Wrap Up   Current Method Vasectomy    End Method Vasectomy    Contraception Counseling Provided No            Examination chaperoned by Malachy Mood LPN    Impression and Plan: 1. Encounter for gynecological examination with Papanicolaou smear of cervix Pap sent Pap in 3 years if negative Physical in 1 year Mammogram was negative 11/06/22 Labs with PCP Colonoscopy per GI Stay active, has done well with keeping weight off  - Cytology - PAP( [place])  2. Encounter for screening fecal occult blood testing Hemoccult was negative May wipe blood, with hemorrhoid, use preparation H and TUCKS if needed  - POCT occult blood stool  3. Post-menopausal Denies any vaginal bleeding

## 2022-09-26 ENCOUNTER — Other Ambulatory Visit (HOSPITAL_COMMUNITY): Payer: Self-pay | Admitting: Family Medicine

## 2022-09-26 DIAGNOSIS — Z1231 Encounter for screening mammogram for malignant neoplasm of breast: Secondary | ICD-10-CM

## 2022-09-27 LAB — CYTOLOGY - PAP
Comment: NEGATIVE
Diagnosis: NEGATIVE
High risk HPV: NEGATIVE

## 2022-11-08 ENCOUNTER — Ambulatory Visit (HOSPITAL_COMMUNITY)
Admission: RE | Admit: 2022-11-08 | Discharge: 2022-11-08 | Disposition: A | Discharge status: Home / Self Care | Payer: 59 | Source: Ambulatory Visit | Attending: Family Medicine | Admitting: Family Medicine

## 2022-11-08 DIAGNOSIS — Z1231 Encounter for screening mammogram for malignant neoplasm of breast: Secondary | ICD-10-CM | POA: Insufficient documentation

## 2023-03-12 ENCOUNTER — Ambulatory Visit: Payer: 59

## 2023-03-12 VITALS — BP 128/82 | HR 74 | Ht 63.0 in | Wt 160.1 lb

## 2023-03-12 DIAGNOSIS — Z23 Encounter for immunization: Secondary | ICD-10-CM | POA: Diagnosis not present

## 2023-03-12 NOTE — Progress Notes (Signed)
 Pt. Came in to office to receive Shingles vaccination. Pt. Received injection w/o complications. MA advised to return in three months for second shot. Pt. Understood.

## 2023-08-15 ENCOUNTER — Encounter: Payer: Self-pay | Admitting: Family Medicine

## 2023-08-15 ENCOUNTER — Ambulatory Visit: Payer: 59 | Admitting: Family Medicine

## 2023-08-15 VITALS — BP 113/78 | Temp 99.1°F | Ht 63.0 in | Wt 152.4 lb

## 2023-08-15 DIAGNOSIS — E038 Other specified hypothyroidism: Secondary | ICD-10-CM

## 2023-08-15 DIAGNOSIS — N814 Uterovaginal prolapse, unspecified: Secondary | ICD-10-CM

## 2023-08-15 DIAGNOSIS — E559 Vitamin D deficiency, unspecified: Secondary | ICD-10-CM

## 2023-08-15 DIAGNOSIS — E782 Mixed hyperlipidemia: Secondary | ICD-10-CM | POA: Diagnosis not present

## 2023-08-15 DIAGNOSIS — R7301 Impaired fasting glucose: Secondary | ICD-10-CM | POA: Diagnosis not present

## 2023-08-15 DIAGNOSIS — E538 Deficiency of other specified B group vitamins: Secondary | ICD-10-CM

## 2023-08-15 NOTE — Patient Instructions (Signed)

## 2023-08-15 NOTE — Progress Notes (Signed)
 Established Patient Office Visit   Subjective  Patient ID: Natasha Oneal, female    DOB: 1965/07/18  Age: 58 y.o. MRN: [id number]  Chief Complaint  Patient presents with   Follow-up    1 year having bladder, she feel like her is dropping, no  freq. Urination , no blood in urine, no pain , she has the feeling like it dropping , she notice in the last couple weeks , she hasn't follow up w/ urology     She  has a past medical history of Breast cyst (03/11/2013), Elevated BP (03/16/2014), Hyperlipidemia, and Positive occult stool blood test (03/16/2014).  The patient describes a constant sensation of heaviness and a bulging feeling in the vagina, which she notices more when squatting. She first became aware of this sensation about two weeks ago, and it has not worsened since onset. Despite trying Kegel exercises, she feels the symptoms have not improved. The sensation is persistent and seems to be triggered or worsened by squatting. She denies any urinary symptoms such as leakage, difficulty starting urination, incomplete emptying, changes in urinary stream, or urgency. Additionally, she reports no constipation, straining during bowel movements, vaginal pain, or discomfort during sexual activity. The sensation has not significantly impacted her daily activities or comfort. She has no history of pelvic surgeries but has had vaginal deliveries approximately 26 years ago. She continues to perform pelvic floor exercises regularly.    Review of Systems  Constitutional:  Negative for chills and fever.  Respiratory:  Negative for shortness of breath.   Cardiovascular:  Negative for chest pain.  Gastrointestinal:  Negative for abdominal pain.  Genitourinary:  Negative for dysuria.  Neurological:  Negative for dizziness and headaches.      Objective:     BP 113/78   Temp 99.1 F (37.3 C)   Ht 5' 3 (1.6 m)   Wt 152 lb 6.4 oz (69.1 kg)   LMP 08/19/2016   BMI 27.00 kg/m  BP Readings  from Last 3 Encounters:  08/15/23 113/78  03/12/23 128/82  09/24/22 136/83      Physical Exam Vitals reviewed.  Constitutional:      General: She is not in acute distress.    Appearance: Normal appearance. She is not ill-appearing, toxic-appearing or diaphoretic.  HENT:     Head: Normocephalic.     Right Ear: Tympanic membrane normal.     Left Ear: Tympanic membrane normal.     Nose: Nose normal.     Mouth/Throat:     Mouth: Mucous membranes are moist.  Eyes:     General:        Right eye: No discharge.        Left eye: No discharge.     Conjunctiva/sclera: Conjunctivae normal.     Pupils: Pupils are equal, round, and reactive to light.  Cardiovascular:     Rate and Rhythm: Normal rate.     Pulses: Normal pulses.     Heart sounds: Normal heart sounds.  Pulmonary:     Effort: Pulmonary effort is normal. No respiratory distress.     Breath sounds: Normal breath sounds.  Abdominal:     General: Bowel sounds are normal.     Palpations: Abdomen is soft.     Tenderness: There is no abdominal tenderness. There is no right CVA tenderness, left CVA tenderness or guarding.  Musculoskeletal:        General: Normal range of motion.     Cervical back: Normal range of  motion.  Skin:    General: Skin is warm and dry.     Capillary Refill: Capillary refill takes less than 2 seconds.  Neurological:     Mental Status: She is alert.     Coordination: Coordination normal.     Gait: Gait normal.  Psychiatric:        Mood and Affect: Mood normal.        Behavior: Behavior normal.      No results found for any visits on 08/15/23.  The 10-year ASCVD risk score (Arnett DK, et al., 2019) is: 1.4%    Assessment & Plan:  Vitamin B12 deficiency -     Vitamin B12  Vitamin D  deficiency -     VITAMIN D  25 Hydroxy (Vit-D Deficiency, Fractures)  IFG (impaired fasting glucose) -     Hemoglobin A1c  TSH (thyroid -stimulating hormone deficiency) -     TSH + free T4  Mixed  hyperlipidemia -     Lipid panel -     CMP14+EGFR -     CBC with Differential/Platelet  Uterine prolapse Assessment & Plan: Patient symptoms consistent with uterine prolapse Referral to GYN for further evaluation Advise to  perform regular pelvic floor exercises (Kegels), avoid heavy lifting and prolonged standing, maintain a healthy weight, and practice proper bowel habits to prevent straining.         Return in about 1 year (around 08/14/2024), or if symptoms worsen or fail to improve, for routine labs, Annual Physical.   Natasha Kidd Wilhelmena Falter, FNP

## 2023-08-15 NOTE — Assessment & Plan Note (Signed)
 Patient symptoms consistent with uterine prolapse Referral to GYN for further evaluation Advise to  perform regular pelvic floor exercises (Kegels), avoid heavy lifting and prolonged standing, maintain a healthy weight, and practice proper bowel habits to prevent straining.

## 2023-08-18 DIAGNOSIS — E538 Deficiency of other specified B group vitamins: Secondary | ICD-10-CM | POA: Diagnosis not present

## 2023-08-18 DIAGNOSIS — E559 Vitamin D deficiency, unspecified: Secondary | ICD-10-CM | POA: Diagnosis not present

## 2023-08-18 DIAGNOSIS — E782 Mixed hyperlipidemia: Secondary | ICD-10-CM | POA: Diagnosis not present

## 2023-08-18 DIAGNOSIS — E038 Other specified hypothyroidism: Secondary | ICD-10-CM | POA: Diagnosis not present

## 2023-08-18 DIAGNOSIS — R7301 Impaired fasting glucose: Secondary | ICD-10-CM | POA: Diagnosis not present

## 2023-08-19 LAB — CBC WITH DIFFERENTIAL/PLATELET
Basophils Absolute: 0 x10E3/uL (ref 0.0–0.2)
Basos: 1 %
EOS (ABSOLUTE): 0 x10E3/uL (ref 0.0–0.4)
Eos: 1 %
Hematocrit: 42 % (ref 34.0–46.6)
Hemoglobin: 13.5 g/dL (ref 11.1–15.9)
Immature Grans (Abs): 0 x10E3/uL (ref 0.0–0.1)
Immature Granulocytes: 0 %
Lymphocytes Absolute: 1.7 x10E3/uL (ref 0.7–3.1)
Lymphs: 28 %
MCH: 29.8 pg (ref 26.6–33.0)
MCHC: 32.1 g/dL (ref 31.5–35.7)
MCV: 93 fL (ref 79–97)
Monocytes Absolute: 0.4 x10E3/uL (ref 0.1–0.9)
Monocytes: 7 %
Neutrophils Absolute: 3.9 x10E3/uL (ref 1.4–7.0)
Neutrophils: 63 %
Platelets: 181 x10E3/uL (ref 150–450)
RBC: 4.53 x10E6/uL (ref 3.77–5.28)
RDW: 12.4 % (ref 11.7–15.4)
WBC: 6 x10E3/uL (ref 3.4–10.8)

## 2023-08-19 LAB — CMP14+EGFR
ALT: 20 IU/L (ref 0–32)
AST: 26 IU/L (ref 0–40)
Albumin: 4.3 g/dL (ref 3.8–4.9)
Alkaline Phosphatase: 65 IU/L (ref 44–121)
BUN/Creatinine Ratio: 17 (ref 9–23)
BUN: 13 mg/dL (ref 6–24)
Bilirubin Total: 0.5 mg/dL (ref 0.0–1.2)
CO2: 23 mmol/L (ref 20–29)
Calcium: 9.4 mg/dL (ref 8.7–10.2)
Chloride: 101 mmol/L (ref 96–106)
Creatinine, Ser: 0.75 mg/dL (ref 0.57–1.00)
Globulin, Total: 2.4 g/dL (ref 1.5–4.5)
Glucose: 92 mg/dL (ref 70–99)
Potassium: 4.2 mmol/L (ref 3.5–5.2)
Sodium: 139 mmol/L (ref 134–144)
Total Protein: 6.7 g/dL (ref 6.0–8.5)
eGFR: 93 mL/min/1.73 (ref >59)

## 2023-08-19 LAB — LIPID PANEL
Chol/HDL Ratio: 2.7 ratio (ref 0.0–4.4)
Cholesterol, Total: 217 mg/dL — ABNORMAL HIGH (ref 100–199)
HDL: 80 mg/dL (ref >39)
LDL Chol Calc (NIH): 124 mg/dL — ABNORMAL HIGH (ref 0–99)
Triglycerides: 72 mg/dL (ref 0–149)
VLDL Cholesterol Cal: 13 mg/dL (ref 5–40)

## 2023-08-19 LAB — HEMOGLOBIN A1C
Est. average glucose Bld gHb Est-mCnc: 111 mg/dL
Hgb A1c MFr Bld: 5.5 % (ref 4.8–5.6)

## 2023-08-19 LAB — VITAMIN D 25 HYDROXY (VIT D DEFICIENCY, FRACTURES): Vit D, 25-Hydroxy: 26.8 ng/mL — ABNORMAL LOW (ref 30.0–100.0)

## 2023-08-19 LAB — TSH+FREE T4
Free T4: 1.2 ng/dL (ref 0.82–1.77)
TSH: 2.09 u[IU]/mL (ref 0.450–4.500)

## 2023-08-19 LAB — VITAMIN B12: Vitamin B-12: 452 pg/mL (ref 232–1245)

## 2023-08-20 ENCOUNTER — Ambulatory Visit: Payer: Self-pay | Admitting: Family Medicine

## 2023-08-25 IMAGING — MG MM DIGITAL SCREENING BILAT W/ TOMO AND CAD
8 series · 8 of 24 positions shown · non-contrast
Comparison: Previous exam(s).

CLINICAL DATA: Screening.

EXAM:
DIGITAL SCREENING BILATERAL MAMMOGRAM WITH TOMOSYNTHESIS AND CAD
TECHNIQUE: Bilateral screening digital craniocaudal and mediolateral oblique
mammograms were obtained. Bilateral screening digital breast
tomosynthesis was performed. The images were evaluated with
computer-aided detection.

[L MLO synth-2D]
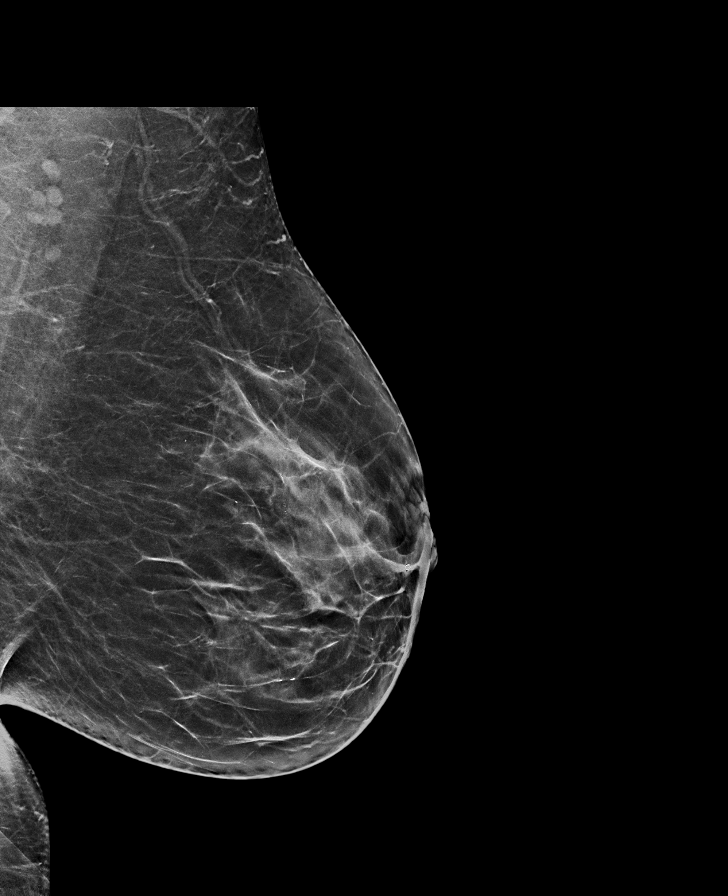

[R CC synth-2D]
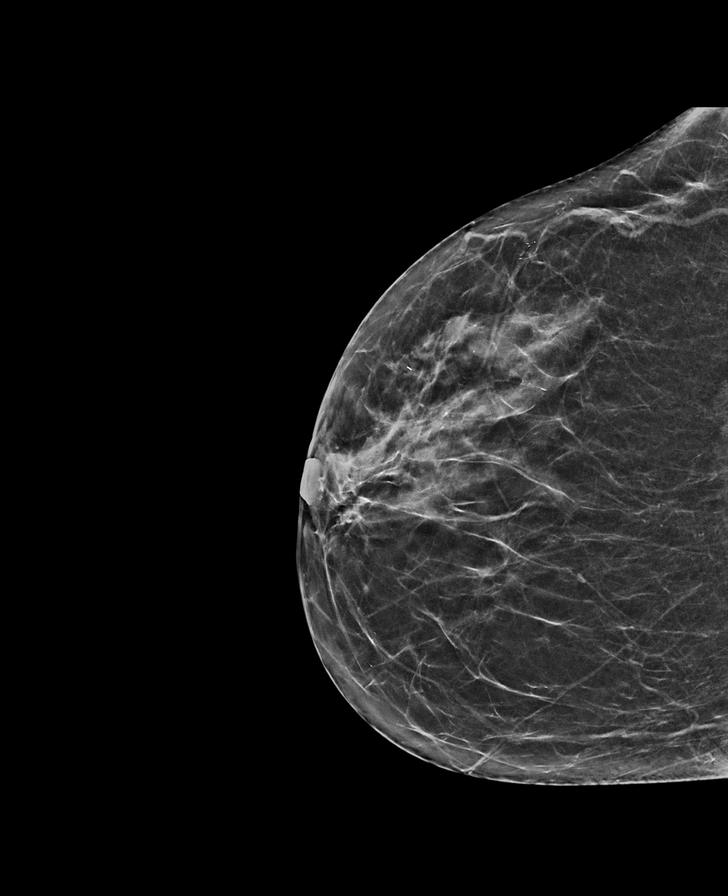

[L CC synth-2D]
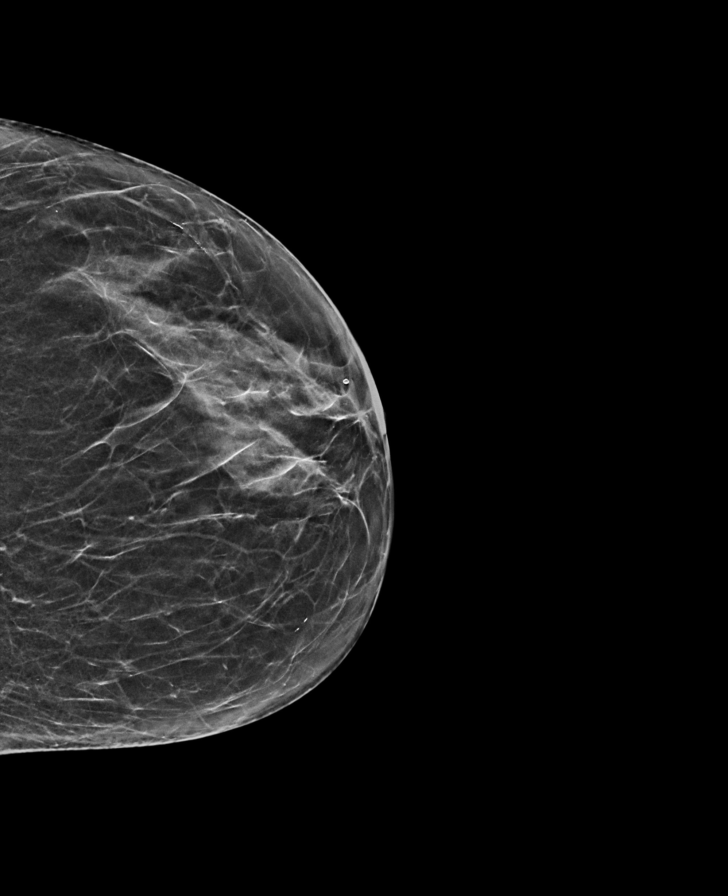

[R MLO synth-2D]
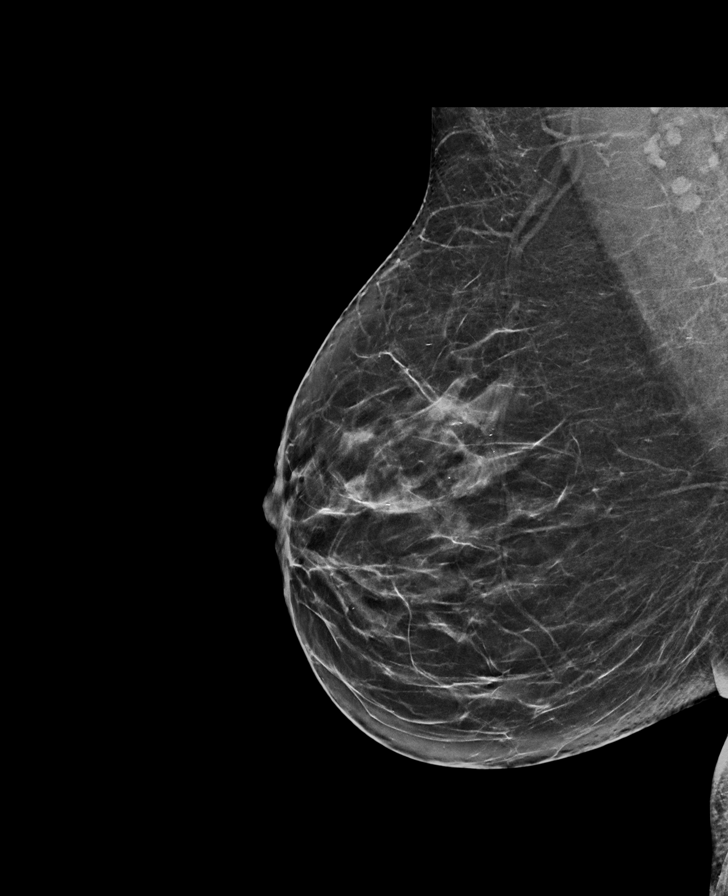

[L MLO tomo · tomo slice 33/66.0]
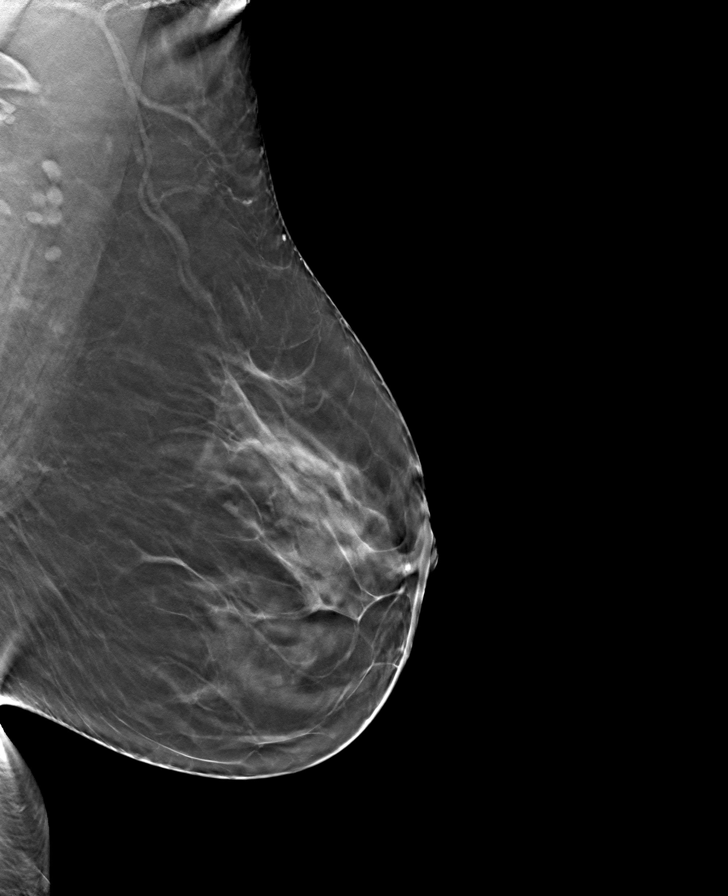

[R MLO tomo · tomo slice 33/65.0]
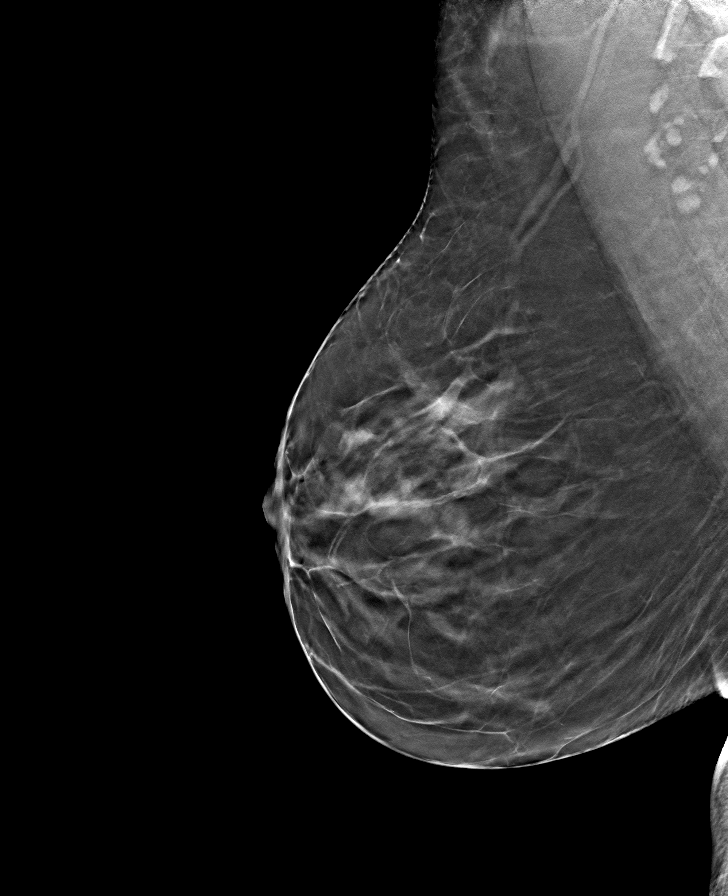

[R CC tomo · tomo slice 29/57.0]
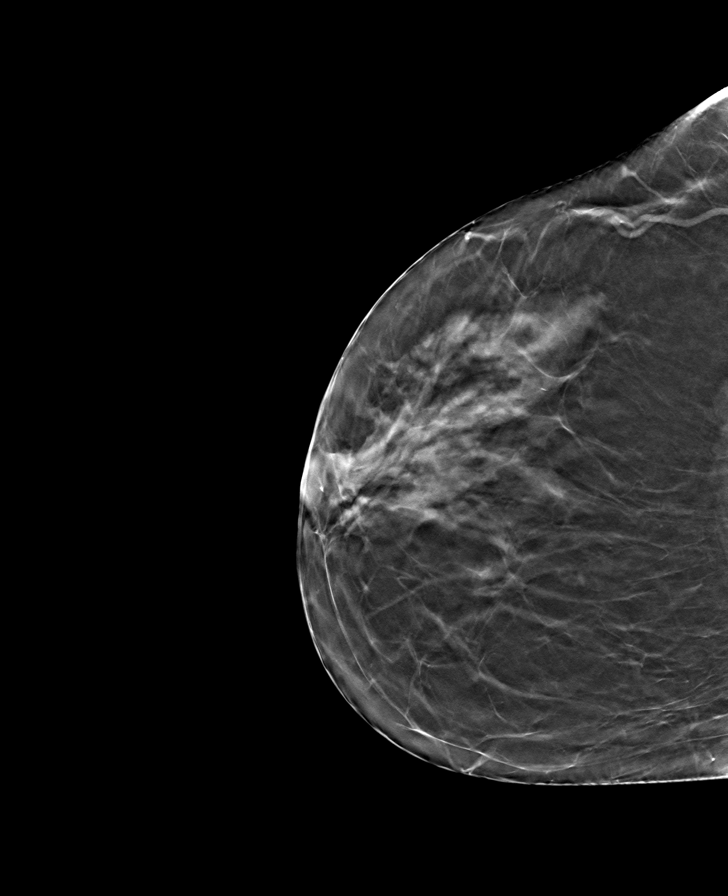

[L CC tomo · tomo slice 27/53.0]
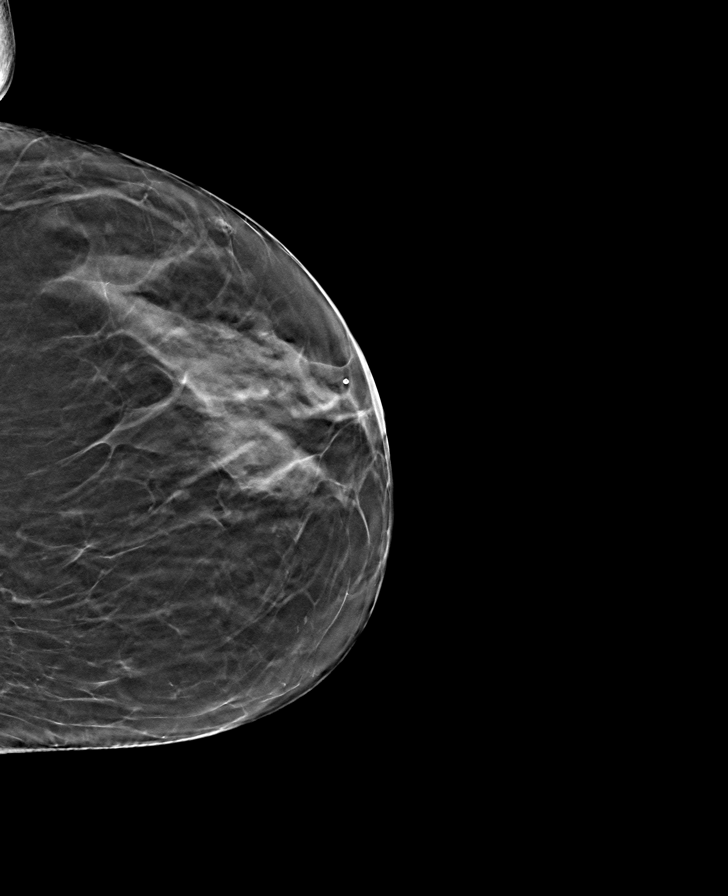

[8 of 24 positions shown; findings below may reference images not displayed]

ACR Breast Density Category c: The breast tissue is heterogeneously
dense, which may obscure small masses.
FINDINGS: There are no findings suspicious for malignancy.
IMPRESSION: No mammographic evidence of malignancy. A result letter of this
screening mammogram will be mailed directly to the patient.

RECOMMENDATION:
Screening mammogram in one year. (Code:Q3-W-BC3)

BI-RADS CATEGORY  1: Negative.

## 2023-08-28 ENCOUNTER — Encounter: Payer: Self-pay | Admitting: Obstetrics & Gynecology

## 2023-08-28 ENCOUNTER — Ambulatory Visit: Admitting: Obstetrics & Gynecology

## 2023-09-23 ENCOUNTER — Encounter: Payer: Self-pay | Admitting: Obstetrics & Gynecology

## 2023-09-23 ENCOUNTER — Ambulatory Visit: Admitting: Obstetrics & Gynecology

## 2023-09-23 VITALS — BP 133/86 | HR 83 | Ht 63.0 in | Wt 156.2 lb

## 2023-09-23 DIAGNOSIS — N8110 Cystocele, unspecified: Secondary | ICD-10-CM

## 2023-09-23 NOTE — Progress Notes (Signed)
   GYN VISIT Patient name: Natasha Oneal MRN [id number]  Date of birth: 1965-09-18 Chief Complaint:   Vaginal Prolapse  History of Present Illness:   Natasha Oneal is a 58 y.o. G1P1001 PM female being seen today for the following concerns:  Vaginal concerns/prolapse: Feels bulge and pressure- started around mid-June.  Worse on super active days- specifically lifting (bodypump) not as bad when running.   Denies urinary concerns.  Denies incontinence.  Denies pelvic or abdominal pain.  Denies dyspareunia. No other acute complaints.  Patient's last menstrual period was 08/19/2016.    Review of Systems:   Pertinent items are noted in HPI Denies fever/chills, dizziness, headaches, visual disturbances, fatigue, shortness of breath, chest pain, abdominal pain, vomiting, no problems with periods, bowel movements, urination, or intercourse unless otherwise stated above.  Pertinent History Reviewed:   Past Surgical History:  Procedure Laterality Date   bladder stem dilitation     under general anesthesia   COLONOSCOPY N/A 01/07/2018   Procedure: COLONOSCOPY;  Surgeon: Shaaron Lamar HERO, MD;  [facility];  Service: Endoscopy;  Laterality: N/A;  1:00   COLONOSCOPY N/A 02/07/2021   Procedure: COLONOSCOPY;  Surgeon: Shaaron Lamar HERO, MD;  [facility];  Service: Endoscopy;  Laterality: N/A;  1:00   POLYPECTOMY  01/07/2018   Procedure: POLYPECTOMY;  Surgeon: Shaaron Lamar HERO, MD;  [facility];  Service: Endoscopy;;  (colon)    Past Medical History:  Diagnosis Date   Breast cyst 03/11/2013   Left breast at 3 0'clock will get F/U US  in march   Elevated BP 03/16/2014   Hyperlipidemia    Positive occult stool blood test 03/16/2014   Reviewed problem list, medications and allergies. Physical Assessment:   Vitals:   09/23/23 1540 09/23/23 1542  BP: (!) 144/78 133/86  Pulse: 83   Weight: 156 lb 3.2 oz (70.9 kg)   Height: 5' 3 (1.6 m)   Body mass index  is 27.67 kg/m.       Physical Examination:   General appearance: alert, well appearing, and in no distress  Psych: mood appropriate, normal affect  Skin: warm & dry   Cardiovascular: normal heart rate noted  Respiratory: normal respiratory effort, no distress  Abdomen: soft, non-tender, no rebound, no guarding  Pelvic: Visible cystocele on initial exam- within introitus.  Normal EXTR genitalia.  Normal pink vaginal mucosa no abnormal discharge or lesions noted.  Normal-appearing cervix.  Stage 2 cystocele with uterine prolapse noted.  With cough- cystocele does not pass level of introitus.  Minimal rectocele  Extremities: no edema   Chaperone: Aleck Blase    Assessment & Plan:  1) Pelvic organ prolapse - Discussed findings on exam and reviewed diagnosis of pelvic organ prolapse, specifically cystocele - Discussed management options including monitoring, pelvic floor therapy-exercises, pessary and ultimately surgical intervention - Patient will plan to do some research on her own and referral for pelvic floor therapy created - questions/concerns were addressed -f/u prn or for annual  No orders of the defined types were placed in this encounter.   Return in about 1 year (around 09/22/2024), or if symptoms worsen or fail to improve, for Annual.   Juvon Teater, DO Attending Obstetrician & Gynecologist, [facility] for [facility]

## 2023-10-13 ENCOUNTER — Other Ambulatory Visit (HOSPITAL_COMMUNITY): Payer: Self-pay | Admitting: Family Medicine

## 2023-10-13 DIAGNOSIS — Z1231 Encounter for screening mammogram for malignant neoplasm of breast: Secondary | ICD-10-CM

## 2023-10-29 DIAGNOSIS — D225 Melanocytic nevi of trunk: Secondary | ICD-10-CM | POA: Diagnosis not present

## 2023-10-29 DIAGNOSIS — Z1283 Encounter for screening for malignant neoplasm of skin: Secondary | ICD-10-CM | POA: Diagnosis not present

## 2023-11-10 ENCOUNTER — Ambulatory Visit (HOSPITAL_COMMUNITY)
Admission: RE | Admit: 2023-11-10 | Discharge: 2023-11-10 | Disposition: A | Discharge status: Home / Self Care | Source: Ambulatory Visit | Attending: Family Medicine | Admitting: Family Medicine

## 2023-11-10 DIAGNOSIS — Z1231 Encounter for screening mammogram for malignant neoplasm of breast: Secondary | ICD-10-CM | POA: Diagnosis not present

## 2023-12-04 ENCOUNTER — Other Ambulatory Visit: Payer: Self-pay

## 2023-12-04 ENCOUNTER — Encounter: Payer: Self-pay | Admitting: Physical Therapy

## 2023-12-04 ENCOUNTER — Ambulatory Visit: Attending: Obstetrics & Gynecology | Admitting: Physical Therapy

## 2023-12-04 DIAGNOSIS — R293 Abnormal posture: Secondary | ICD-10-CM | POA: Insufficient documentation

## 2023-12-04 DIAGNOSIS — N8110 Cystocele, unspecified: Secondary | ICD-10-CM | POA: Insufficient documentation

## 2023-12-04 DIAGNOSIS — R279 Unspecified lack of coordination: Secondary | ICD-10-CM | POA: Diagnosis not present

## 2023-12-04 DIAGNOSIS — M6281 Muscle weakness (generalized): Secondary | ICD-10-CM | POA: Insufficient documentation

## 2023-12-04 NOTE — Therapy (Signed)
 OUTPATIENT PHYSICAL THERAPY FEMALE PELVIC EVALUATION   Patient Name: Natasha Oneal MRN: [id number] DOB:[birth year], 58 y.o., female Today's Date: 12/04/2023  END OF SESSION:  PT End of Session - 12/04/23 1657     Visit Number 1    Number of Visits 10    Date for Recertification  02/12/24    Authorization Type [employer] Save -  Employee    PT Start Time [phone]    PT Stop Time 0500    PT Time Calculation (min) 45 min    Activity Tolerance Patient tolerated treatment well    Behavior During Therapy [facility] for tasks assessed/performed          Past Medical History:  Diagnosis Date   Breast cyst 03/11/2013   Left breast at 3 0'clock will get F/U US  in march   Elevated BP 03/16/2014   Hyperlipidemia    Positive occult stool blood test 03/16/2014   Past Surgical History:  Procedure Laterality Date   bladder stem dilitation     under general anesthesia   COLONOSCOPY N/A 01/07/2018   Procedure: COLONOSCOPY;  Surgeon: Shaaron Lamar HERO, MD;  Location: [facility];  Service: Endoscopy;  Laterality: N/A;  1:00   COLONOSCOPY N/A 02/07/2021   Procedure: COLONOSCOPY;  Surgeon: Shaaron Lamar HERO, MD;  Location: [facility];  Service: Endoscopy;  Laterality: N/A;  1:00   POLYPECTOMY  01/07/2018   Procedure: POLYPECTOMY;  Surgeon: Shaaron Lamar HERO, MD;  Location: [facility];  Service: Endoscopy;;  (colon)   Patient Active Problem List   Diagnosis Date Noted   Uterine prolapse 08/15/2023   Encounter for routine adult physical exam with abnormal findings 08/14/2022   Encounter for screening fecal occult blood testing 08/03/2020   Encounter for gynecological examination with Papanicolaou smear of cervix 07/29/2019   Post-menopausal 10/20/2017   Night sweats 10/20/2017   Hot flashes due to menopause 10/20/2017   Screening for colorectal cancer 10/20/2017   Encounter for well woman exam with routine gynecological exam 10/20/2017   Vitamin D  insufficiency 09/16/2016   Positive  occult stool blood test 03/16/2014   Elevated BP 03/16/2014   Breast cyst 03/11/2013    PCP: Terry Wilhelmena Lloyd Hilario, FNP  REFERRING PROVIDER: Ozan, Jennifer, DO   REFERRING DIAG: N81.10 (ICD-10-CM) - Pelvic organ prolapse quantification stage 2 cystocele  THERAPY DIAG:  Pelvic organ prolapse quantification stage 2 cystocele  Muscle weakness (generalized)  Unspecified lack of coordination  Abnormal posture  Rationale for Evaluation and Treatment: Rehabilitation  ONSET DATE: June of this year   SUBJECTIVE:  SUBJECTIVE STATEMENT: Eval: Patient reports to PFPT with POP cystocele grade 2. In June of this year, she started noticing a bulge and pressure in the vaginal canal. Her OBGYN recommended pelvic PT as the first treatment, then a pessary, and surgery as a last resort, which the patient does not wish to have. She runs 3 times a week and does body pump 2 times per week. She notices her prolapse when she squats during body pump.  Fluid intake: yeti rambler full of water  throughout the day at work, drinks water  with all meals, has one cup of coffee   FUNCTIONAL LIMITATIONS: annoying during workout class   PERTINENT HISTORY:  Medications for current condition: none Surgeries: none  Other: none  Sexual abuse: No  PAIN:  Are you having pain? No NPRS scale: 0/10  PRECAUTIONS: None  RED FLAGS: None   WEIGHT BEARING RESTRICTIONS: No  FALLS:  Has patient fallen in last 6 months? 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   ACTIVITY LEVEL : 3 days of running per week, 2 days of body pump, 1 day of walking   PLOF: Independent with basic ADLs  PATIENT GOALS: to manage the prolapse symptoms, to strengthen the musculature, to ensure prolapse does  not progress   BOWEL MOVEMENT: Pain with bowel movement: No Type of bowel movement:Type (Bristol Stool Scale) 4-5, Frequency 1x/day, Strain no, and Splinting no Fully empty rectum: Yes:   Leakage: No                                                     Caused by:  Pads: No Fiber supplement/laxative No  URINATION: Pain with urination: No Fully empty bladder: Yes:                                  Post-void dribble: No Stream: Strong Urgency: Yes  Frequency:during the day within normal limits                                                          Nocturia: Yes: 1x/night   Leakage: none Pads/briefs: No  INTERCOURSE:  Ability to have vaginal penetration Yes  Pain with intercourse: none Dryness: Yes  Climax: yes Marinoff Scale: 0/3 Lubricant: yes but not sure the brand   PREGNANCY: Vaginal deliveries 1 Tearing Yes: with stitches   PROLAPSE: Pressure  OBJECTIVE:  Note: Objective measures were completed at Evaluation unless otherwise noted.  PATIENT SURVEYS:  PFIQ-7: 45  COGNITION: Overall cognitive status: Within functional limits for tasks assessed     SENSATION: Light touch: Appears intact  LUMBAR SPECIAL TESTS:  Straight leg raise test: Positive  FUNCTIONAL TESTS:  Single leg stance:  Rt: +  Lt:+ Sit-up test: 1/3 Squat: within functional limits  Bed mobility: within normal limits   GAIT: Assistive device utilized: None Comments: mild trendelenburg gait pattern with ambulation   POSTURE: rounded shoulders  LUMBARAROM/PROM: within normal limits for all motions bilaterally with no pain   LOWER EXTREMITY ROM: within normal limits for all motions bilaterally with no  pain   LOWER EXTREMITY MMT: 4/5 bilateral hips grossly   PALPATION:  General: no tenderness to palpation of bilateral hip flexors or adductors   Pelvic Alignment: within normal limits   Abdominal: abdominal bracing at rest   Diastasis: No Distortion: No  Breathing: apical breathing  pattern with decreased lower rib excursion with inhalation  Scar tissue: No                External Perineal Exam: dryness present with no tenderness to transverse perineal body or pubic bone                              Internal Pelvic Floor: Pt demonstrates anterior vaginal wall weakness that causes prolapse excursion toward introitus with cough test and with bearing down to strain. She demonstrates a weak pelvic floor with a lack of coordination between the diaphragm and pelvic floor musculature. She had no pain during today's examination and no palpable trigger points in the pelvic floor.   Patient confirms identification and approves PT to assess internal pelvic floor and treatment Yes No emotional/communication barriers or cognitive limitation. Patient is motivated to learn. Patient understands and agrees with treatment goals and plan. PT explains patient will be examined in standing, sitting, and lying down to see how their muscles and joints work. When they are ready, they will be asked to remove their underwear so PT can examine their perineum. The patient is also given the option of providing their own chaperone as one is not provided in our facility. The patient also has the right and is explained the right to defer or refuse any part of the evaluation or treatment including the internal exam. With the patient's consent, PT will use one gloved finger to gently assess the muscles of the pelvic floor, seeing how well it contracts and relaxes and if there is muscle symmetry. After, the patient will get dressed and PT and patient will discuss exam findings and plan of care. PT and patient discuss plan of care, schedule, attendance policy and HEP activities.  PELVIC MMT:   MMT eval  Vaginal 2/5, 4 quick flicks  Internal Anal Sphincter   External Anal Sphincter   Puborectalis   Diastasis Recti   (Blank rows = not tested)  TONE: Low in bilateral aspects of superficial and deep pelvic floor  musculature   PROLAPSE: Anterior vaginal wall prolapse grade 2 present in hooklying not extending past introitus   TODAY'S TREATMENT:                                                                                                                              DATE:   EVAL 12/04/23: Examination completed, findings reviewed, pt educated on POC, HEP, and self care. Pt motivated to participate in PT and agreeable to attempt recommendations.   - Supine Pelvic Floor Contraction  - 1 x daily - 7 x  weekly - 3 sets - 10 reps - Quick Flick Pelvic Floor Contractions in Hooklying  - 1 x daily - 7 x weekly - 3 sets - 10 reps  PATIENT EDUCATION:  Education details: relative anatomy and the connection between the diaphragm and pelvic floor musculature  Person educated: Patient Education method: Explanation, Demonstration, Tactile cues, Verbal cues, and Handouts Education comprehension: verbalized understanding, returned demonstration, verbal cues required, tactile cues required, and needs further education  HOME EXERCISE PROGRAM: Access Code: [email or web address] URL: [email or web address] Date: 12/04/2023 Prepared by: Celena Domino  Exercises - Supine Pelvic Floor Contraction  - 1 x daily - 7 x weekly - 3 sets - 10 reps - Quick Flick Pelvic Floor Contractions in Hooklying  - 1 x daily - 7 x weekly - 3 sets - 10 reps  Patient Education - Get To Know Your Pelvic Floor- Female  ASSESSMENT:  CLINICAL IMPRESSION: Patient is a 58 y.o. female  who was seen today for physical therapy evaluation and treatment for pelvic organ prolapse (stage 2 cystocele). In June of this year, she started noticing a bulge and pressure in the vaginal canal. Her OBGYN recommended pelvic PT as the first treatment, then a pessary, and surgery as a last resort, which the patient does not wish to have. She runs 3 times a week and does body pump 2 times per week. She notices her prolapse when she squats during body pump. Pt  demonstrates anterior vaginal wall weakness that causes prolapse excursion toward introitus with cough test and with bearing down to strain. She demonstrates a weak pelvic floor with a lack of coordination between the diaphragm and pelvic floor musculature. There is visible anterior vaginal wall prolapse in hooklying position during exam, not protruding past the introitus. She had no pain during today's examination and no palpable trigger points in the pelvic floor. Overall, pt tolerated session well and Pt would benefit from additional PT to further address deficits.    OBJECTIVE IMPAIRMENTS: decreased coordination, decreased endurance, decreased mobility, decreased ROM, and decreased strength.   ACTIVITY LIMITATIONS: carrying, lifting, bending, sitting, standing, squatting, stairs, transfers, and bed mobility  PARTICIPATION LIMITATIONS: cleaning, laundry, shopping, community activity, occupation, and yard work  PERSONAL FACTORS: Age, Past/current experiences, and Time since onset of injury/illness/exacerbation are also affecting patient's functional outcome.   REHAB POTENTIAL: Good  CLINICAL DECISION MAKING: Stable/uncomplicated  EVALUATION COMPLEXITY: Low   GOALS: Goals reviewed with patient? Yes  SHORT TERM GOALS: Target date: 01/01/2024  Pt will be independent with HEP.  Baseline: Goal status: INITIAL  2.  Pt will be independent with the knack, urge suppression technique, and double voiding in order to improve bladder habits and decrease urinary incontinence.  Baseline:  Goal status: INITIAL  3.  Pt will be independent with use of squatty potty, relaxed toileting mechanics, and improved bowel movement techniques in order to increase ease of bowel movements and complete evacuation.  Baseline:  Goal status: INITIAL  4.  Pt will be able to correctly perform diaphragmatic breathing and appropriate pressure management in order to prevent worsening vaginal wall laxity and improve  pelvic floor A/ROM.  Baseline:  Goal status: INITIAL  LONG TERM GOALS: Target date: 06/03/2024  Pt will be independent with advanced HEP.  Baseline:  Goal status: INITIAL  2.  Pt to demonstrate improved coordination of pelvic floor and breathing mechanics with 10# squat with appropriate synergistic patterns to decrease vaginal pressure at least 75% of the time for improved ability to complete a 30 minute  workout with strain at pelvic floor and symptoms.   Baseline:  Goal status: INITIAL  3.  Pt will demonstrate normal pelvic floor muscle tone and A/ROM, able to achieve 3/5 strength with contractions and 10 sec endurance, in order to reduce urinary leaking and number of pads patient wears.   Baseline:  Goal status: INITIAL  4.  Pt will report ability to perform a squat at body pump with little to no pelvic organ prolapse pressure to improve quality of life and allow pt to participate in exercise without pelvic floor dysfunction. Baseline:  Goal status: INITIAL  PLAN:  PT FREQUENCY: 1-2x/week  PT DURATION: 6 months  PLANNED INTERVENTIONS: 97110-Therapeutic exercises, 97530- Therapeutic activity, 97112- Neuromuscular re-education, 97535- Self Care, [id number]- Manual therapy, Patient/Family education, Stair training, Taping, Joint mobilization, Spinal mobilization, Scar mobilization, Cryotherapy, Moist heat, and Biofeedback  PLAN FOR NEXT SESSION: continued pelvic floor AROM in seated, prolapse pressure management techniques, toileting techniques, hip and core strengthening and stretching to back   Celena JAYSON Domino, PT 12/04/2023, 5:10 PM

## 2023-12-23 ENCOUNTER — Ambulatory Visit: Payer: Self-pay | Attending: Obstetrics & Gynecology | Admitting: Physical Therapy

## 2023-12-23 DIAGNOSIS — N8110 Cystocele, unspecified: Secondary | ICD-10-CM | POA: Diagnosis not present

## 2023-12-23 DIAGNOSIS — M6281 Muscle weakness (generalized): Secondary | ICD-10-CM | POA: Diagnosis not present

## 2023-12-23 DIAGNOSIS — R279 Unspecified lack of coordination: Secondary | ICD-10-CM | POA: Diagnosis not present

## 2023-12-23 NOTE — Therapy (Signed)
 OUTPATIENT PHYSICAL THERAPY FEMALE PELVIC TREATMENT   Patient Name: Natasha Oneal MRN: [id number] DOB:1965-08-04, 58 y.o., female Today's Date: 12/23/2023  END OF SESSION:  PT End of Session - 12/23/23 1528     Visit Number 2    Number of Visits 10    Date for Recertification  02/12/24    Authorization Type [employer] Employee    PT Start Time [phone]    PT Stop Time 0330    PT Time Calculation (min) 45 min    Activity Tolerance Patient tolerated treatment well    Behavior During Therapy [facility] for tasks assessed/performed           Past Medical History:  Diagnosis Date   Breast cyst 03/11/2013   Left breast at 3 0'clock will get F/U US  in march   Elevated BP 03/16/2014   Hyperlipidemia    Positive occult stool blood test 03/16/2014   Past Surgical History:  Procedure Laterality Date   bladder stem dilitation     under general anesthesia   COLONOSCOPY N/A 01/07/2018   Procedure: COLONOSCOPY;  Surgeon: Shaaron Lamar HERO, MD;  Location: [facility];  Service: Endoscopy;  Laterality: N/A;  1:00   COLONOSCOPY N/A 02/07/2021   Procedure: COLONOSCOPY;  Surgeon: Shaaron Lamar HERO, MD;  Location: [facility];  Service: Endoscopy;  Laterality: N/A;  1:00   POLYPECTOMY  01/07/2018   Procedure: POLYPECTOMY;  Surgeon: Shaaron Lamar HERO, MD;  Location: [facility];  Service: Endoscopy;;  (colon)   Patient Active Problem List   Diagnosis Date Noted   Uterine prolapse 08/15/2023   Encounter for routine adult physical exam with abnormal findings 08/14/2022   Encounter for screening fecal occult blood testing 08/03/2020   Encounter for gynecological examination with Papanicolaou smear of cervix 07/29/2019   Post-menopausal 10/20/2017   Night sweats 10/20/2017   Hot flashes due to menopause 10/20/2017   Screening for colorectal cancer 10/20/2017   Encounter for well woman exam with routine gynecological exam 10/20/2017   Vitamin D  insufficiency 09/16/2016   Positive  occult stool blood test 03/16/2014   Elevated BP 03/16/2014   Breast cyst 03/11/2013    PCP: Terry Wilhelmena Lloyd Hilario, FNP  REFERRING PROVIDER: Ozan, Jennifer, DO   REFERRING DIAG: N81.10 (ICD-10-CM) - Pelvic organ prolapse quantification stage 2 cystocele  THERAPY DIAG:  Pelvic organ prolapse quantification stage 2 cystocele  Muscle weakness (generalized)  Unspecified lack of coordination  Rationale for Evaluation and Treatment: Rehabilitation  ONSET DATE: June of this year   SUBJECTIVE:  SUBJECTIVE STATEMENT: Patient reports that she has been consistent with her HEP every day - she had body pump this morning and she has been carrying boxes up and down stairs to decorate for christmas. No pain, just pressure. No urinary concerns. Bowels movements have been regular. She did some weighted squats this morning (25#).   Eval: Patient reports to PFPT with POP cystocele grade 2. In June of this year, she started noticing a bulge and pressure in the vaginal canal. Her OBGYN recommended pelvic PT as the first treatment, then a pessary, and surgery as a last resort, which the patient does not wish to have. She runs 3 times a week and does body pump 2 times per week. She notices her prolapse when she squats during body pump.  Fluid intake: yeti rambler full of water  throughout the day at work, drinks water  with all meals, has one cup of coffee   FUNCTIONAL LIMITATIONS: annoying during workout class   PERTINENT HISTORY:  Medications for current condition: none Surgeries: none  Other: none  Sexual abuse: No  PAIN:  Are you having pain? No NPRS scale: 0/10  PRECAUTIONS: None  RED FLAGS: None   WEIGHT BEARING RESTRICTIONS: No  FALLS:  Has patient fallen in last 6 months? 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   ACTIVITY LEVEL : 3 days of running per week, 2 days of body pump, 1 day of walking   PLOF: Independent with basic ADLs  PATIENT GOALS: to manage the prolapse symptoms, to strengthen the musculature, to ensure prolapse does not progress   BOWEL MOVEMENT: Pain with bowel movement: No Type of bowel movement:Type (Bristol Stool Scale) 4-5, Frequency 1x/day, Strain no, and Splinting no Fully empty rectum: Yes:   Leakage: No                                                     Caused by:  Pads: No Fiber supplement/laxative No  URINATION: Pain with urination: No Fully empty bladder: Yes:                                  Post-void dribble: No Stream: Strong Urgency: Yes  Frequency:during the day within normal limits                                                          Nocturia: Yes: 1x/night   Leakage: none Pads/briefs: No  INTERCOURSE:  Ability to have vaginal penetration Yes  Pain with intercourse: none Dryness: Yes  Climax: yes Marinoff Scale: 0/3 Lubricant: yes but not sure the brand   PREGNANCY: Vaginal deliveries 1 Tearing Yes: with stitches   PROLAPSE: Pressure  OBJECTIVE:  Note: Objective measures were completed at Evaluation unless otherwise noted.  PATIENT SURVEYS:  PFIQ-7: 45  COGNITION: Overall cognitive status: Within functional limits for tasks assessed     SENSATION: Light touch: Appears intact  LUMBAR SPECIAL TESTS:  Straight leg raise test: Positive  FUNCTIONAL TESTS:  Single leg stance:  Rt: +  Lt:+ Sit-up test: 1/3  Squat: within functional limits  Bed mobility: within normal limits   GAIT: Assistive device utilized: None Comments: mild trendelenburg gait pattern with ambulation   POSTURE: rounded shoulders  LUMBARAROM/PROM: within normal limits for all motions bilaterally with no pain   LOWER EXTREMITY ROM: within normal limits for all motions bilaterally with no pain    LOWER EXTREMITY MMT: 4/5 bilateral hips grossly   PALPATION:  General: no tenderness to palpation of bilateral hip flexors or adductors   Pelvic Alignment: within normal limits   Abdominal: abdominal bracing at rest   Diastasis: No Distortion: No  Breathing: apical breathing pattern with decreased lower rib excursion with inhalation  Scar tissue: No                External Perineal Exam: dryness present with no tenderness to transverse perineal body or pubic bone                              Internal Pelvic Floor: Pt demonstrates anterior vaginal wall weakness that causes prolapse excursion toward introitus with cough test and with bearing down to strain. She demonstrates a weak pelvic floor with a lack of coordination between the diaphragm and pelvic floor musculature. She had no pain during today's examination and no palpable trigger points in the pelvic floor.   Patient confirms identification and approves PT to assess internal pelvic floor and treatment Yes No emotional/communication barriers or cognitive limitation. Patient is motivated to learn. Patient understands and agrees with treatment goals and plan. PT explains patient will be examined in standing, sitting, and lying down to see how their muscles and joints work. When they are ready, they will be asked to remove their underwear so PT can examine their perineum. The patient is also given the option of providing their own chaperone as one is not provided in our facility. The patient also has the right and is explained the right to defer or refuse any part of the evaluation or treatment including the internal exam. With the patient's consent, PT will use one gloved finger to gently assess the muscles of the pelvic floor, seeing how well it contracts and relaxes and if there is muscle symmetry. After, the patient will get dressed and PT and patient will discuss exam findings and plan of care. PT and patient discuss plan of care,  schedule, attendance policy and HEP activities.  PELVIC MMT:   MMT eval  Vaginal 2/5, 4 quick flicks  Internal Anal Sphincter   External Anal Sphincter   Puborectalis   Diastasis Recti   (Blank rows = not tested)  TONE: Low in bilateral aspects of superficial and deep pelvic floor musculature   PROLAPSE: Anterior vaginal wall prolapse grade 2 present in hooklying not extending past introitus   TODAY'S TREATMENT:  DATE:   EVAL 12/04/23: Examination completed, findings reviewed, pt educated on POC, HEP, and self care. Pt motivated to participate in PT and agreeable to attempt recommendations.   - Supine Pelvic Floor Contraction  - 1 x daily - 7 x weekly - 3 sets - 10 reps - Quick Flick Pelvic Floor Contractions in Hooklying  - 1 x daily - 7 x weekly - 3 sets - 10 reps  12/23/23: Seated pelvic floor contraction + diaphragmatic breathing 2x10 (towel roll under perineum to provide tactile feedback)  Seated quick flick pelvic floor contractions + diaphragmatic breathing 2x10  Bridge + pelvic floor contraction + diaphragmatic breathing 2x10  Squat with chair touch + pelvic floor contraction + diaphragmatic breathing 2x10  Double leg raises + diaphragmatic breathing 2x10  Bird dog + diaphragmatic breathing 2x10  Cross body oblique crunch + diaphragmatic breathing 2x10  Lateral lunge (mini lunge) + pelvic floor contraction + diaphragmatic breathing 2x10  Discussion of exhalation during strenuous exercises in body pump class   PATIENT EDUCATION:  Education details: relative anatomy and the connection between the diaphragm and pelvic floor musculature  Person educated: Patient Education method: Explanation, Demonstration, Tactile cues, Verbal cues, and Handouts Education comprehension: verbalized understanding, returned demonstration, verbal cues required, tactile  cues required, and needs further education  HOME EXERCISE PROGRAM: Access Code: [email or web address] URL: [email or web address] Date: 12/23/2023 Prepared by: Celena Domino  Exercises - Seated Pelvic Floor Contraction  - 1 x daily - 7 x weekly - 2 sets - 10 reps - Seated Quick Flick Pelvic Floor Contractions  - 1 x daily - 7 x weekly - 2 sets - 10 reps - Supine Double Bent Leg Lift  - 1 x daily - 7 x weekly - 2 sets - 10 reps - Oblique Crunch  - 1 x daily - 7 x weekly - 2 sets - 10 reps - Bird Dog  - 1 x daily - 7 x weekly - 2 sets - 10 reps - Supine Bridge with Pelvic Floor Contraction  - 1 x daily - 7 x weekly - 2 sets - 10 reps - Squat with Chair Touch  - 1 x daily - 7 x weekly - 2 sets - 10 reps - Standing Side Lunge With Pelvic Floor Contraction  - 1 x daily - 7 x weekly - 2 sets - 8 reps  Patient Education - Get To Know Your Pelvic Floor- Female  ASSESSMENT:  CLINICAL IMPRESSION: Patient is a 58 y.o. female  who was seen today for physical therapy treatment for pelvic organ prolapse (stage 2 cystocele). Pt has been consistent with HEP daily and has had no prolapse symptoms other than with crunching and squatting at body pump class. We reviewed breathing techniques for these types of exercises and added examples to her HEP for her to practice. She had worsening prolapse symptoms with a deep bodyweight squat, but this went away when she reduced the depth of the squat and added in a pelvic floor contraction. Overall, pt tolerated session well and Pt would benefit from additional PT to further address deficits.    OBJECTIVE IMPAIRMENTS: decreased coordination, decreased endurance, decreased mobility, decreased ROM, and decreased strength.   ACTIVITY LIMITATIONS: carrying, lifting, bending, sitting, standing, squatting, stairs, transfers, and bed mobility  PARTICIPATION LIMITATIONS: cleaning, laundry, shopping, community activity, occupation, and yard work  PERSONAL FACTORS: Age,  Past/current experiences, and Time since onset of injury/illness/exacerbation are also affecting patient's functional outcome.   REHAB POTENTIAL: Good  CLINICAL DECISION MAKING:  Stable/uncomplicated  EVALUATION COMPLEXITY: Low   GOALS: Goals reviewed with patient? Yes  SHORT TERM GOALS: Target date: 01/01/2024  Pt will be independent with HEP.  Baseline: Goal status: INITIAL  2.  Pt will be independent with the knack, urge suppression technique, and double voiding in order to improve bladder habits and decrease urinary incontinence.  Baseline:  Goal status: INITIAL  3.  Pt will be independent with use of squatty potty, relaxed toileting mechanics, and improved bowel movement techniques in order to increase ease of bowel movements and complete evacuation.  Baseline:  Goal status: INITIAL  4.  Pt will be able to correctly perform diaphragmatic breathing and appropriate pressure management in order to prevent worsening vaginal wall laxity and improve pelvic floor A/ROM.  Baseline:  Goal status: INITIAL  LONG TERM GOALS: Target date: 06/03/2024  Pt will be independent with advanced HEP.  Baseline:  Goal status: INITIAL  2.  Pt to demonstrate improved coordination of pelvic floor and breathing mechanics with 10# squat with appropriate synergistic patterns to decrease vaginal pressure at least 75% of the time for improved ability to complete a 30 minute workout with strain at pelvic floor and symptoms.   Baseline:  Goal status: INITIAL  3.  Pt will demonstrate normal pelvic floor muscle tone and A/ROM, able to achieve 3/5 strength with contractions and 10 sec endurance, in order to reduce urinary leaking and number of pads patient wears.   Baseline:  Goal status: INITIAL  4.  Pt will report ability to perform a squat at body pump with little to no pelvic organ prolapse pressure to improve quality of life and allow pt to participate in exercise without pelvic floor  dysfunction. Baseline:  Goal status: INITIAL  PLAN:  PT FREQUENCY: 1-2x/week  PT DURATION: 6 months  PLANNED INTERVENTIONS: 97110-Therapeutic exercises, 97530- Therapeutic activity, 97112- Neuromuscular re-education, 97535- Self Care, [id number]- Manual therapy, Patient/Family education, Stair training, Taping, Joint mobilization, Spinal mobilization, Scar mobilization, Cryotherapy, Moist heat, and Biofeedback  PLAN FOR NEXT SESSION: continued pelvic floor AROM in seated, prolapse pressure management techniques, toileting techniques, hip and core strengthening and stretching to back   Celena JAYSON Domino, PT 12/23/2023, 3:28 PM

## 2024-02-03 ENCOUNTER — Ambulatory Visit: Attending: Obstetrics & Gynecology | Admitting: Physical Therapy

## 2024-02-03 DIAGNOSIS — M6281 Muscle weakness (generalized): Secondary | ICD-10-CM | POA: Diagnosis present

## 2024-02-03 DIAGNOSIS — N8110 Cystocele, unspecified: Secondary | ICD-10-CM | POA: Insufficient documentation

## 2024-02-03 DIAGNOSIS — R279 Unspecified lack of coordination: Secondary | ICD-10-CM | POA: Insufficient documentation

## 2024-02-03 DIAGNOSIS — R293 Abnormal posture: Secondary | ICD-10-CM | POA: Diagnosis present

## 2024-02-03 NOTE — Therapy (Signed)
 " OUTPATIENT PHYSICAL THERAPY FEMALE PELVIC TREATMENT   Patient Name: Natasha Oneal MRN: [id number] DOB:02-04-1966, 58 y.o., female Today's Date: 02/03/2024  END OF SESSION:  PT End of Session - 02/03/24 1626     Visit Number 3    Number of Visits 10    Date for Recertification  02/12/24    Authorization Type [employer] - [employer]    PT Start Time [phone]    PT Stop Time 0500    PT Time Calculation (min) 45 min    Activity Tolerance Patient tolerated treatment well    Behavior During Therapy [facility] for tasks assessed/performed            Past Medical History:  Diagnosis Date   Breast cyst 03/11/2013   Left breast at 3 0'clock will get F/U US  in march   Elevated BP 03/16/2014   Hyperlipidemia    Positive occult stool blood test 03/16/2014   Past Surgical History:  Procedure Laterality Date   bladder stem dilitation     under general anesthesia   COLONOSCOPY N/A 01/07/2018   Procedure: COLONOSCOPY;  Surgeon: Shaaron Lamar HERO, MD;  [facility]: [facility];  Service: Endoscopy;  Laterality: N/A;  1:00   COLONOSCOPY N/A 02/07/2021   Procedure: COLONOSCOPY;  Surgeon: Shaaron Lamar HERO, MD;  Location: [facility];  Service: Endoscopy;  Laterality: N/A;  1:00   POLYPECTOMY  01/07/2018   Procedure: POLYPECTOMY;  Surgeon: Shaaron Lamar HERO, MD;  Location: [facility];  Service: Endoscopy;;  (colon)   Patient Active Problem List   Diagnosis Date Noted   Uterine prolapse 08/15/2023   Encounter for routine adult physical exam with abnormal findings 08/14/2022   Encounter for screening fecal occult blood testing 08/03/2020   Encounter for gynecological examination with Papanicolaou smear of cervix 07/29/2019   Post-menopausal 10/20/2017   Night sweats 10/20/2017   Hot flashes due to menopause 10/20/2017   Screening for colorectal cancer 10/20/2017   Encounter for well woman exam with routine gynecological exam 10/20/2017   Vitamin D  insufficiency 09/16/2016   Positive  occult stool blood test 03/16/2014   Elevated BP 03/16/2014   Breast cyst 03/11/2013    PCP: Terry Wilhelmena Lloyd Hilario, FNP  REFERRING PROVIDER: Ozan, Jennifer, DO   REFERRING DIAG: N81.10 (ICD-10-CM) - Pelvic organ prolapse quantification stage 2 cystocele  THERAPY DIAG:  Pelvic organ prolapse quantification stage 2 cystocele  Muscle weakness (generalized)  Unspecified lack of coordination  Abnormal posture  Rationale for Evaluation and Treatment: Rehabilitation  ONSET DATE: June of this year   SUBJECTIVE:  SUBJECTIVE STATEMENT: Patient reports that body pump has been going well - she went to body pump at 5 am and for it to be a body pump day, the prolapse is feeling pretty good. No pain, just pressure. No issues with bowel or urinary control.   Eval: Patient reports to PFPT with POP cystocele grade 2. In June of this year, she started noticing a bulge and pressure in the vaginal canal. Her OBGYN recommended pelvic PT as the first treatment, then a pessary, and surgery as a last resort, which the patient does not wish to have. She runs 3 times a week and does body pump 2 times per week. She notices her prolapse when she squats during body pump.  Fluid intake: yeti rambler full of water  throughout the day at work, drinks water  with all meals, has one cup of coffee   FUNCTIONAL LIMITATIONS: annoying during workout class   PERTINENT HISTORY:  Medications for current condition: none Surgeries: none  Other: none  Sexual abuse: No  PAIN:  Are you having pain? No NPRS scale: 0/10  PRECAUTIONS: None  RED FLAGS: None   WEIGHT BEARING RESTRICTIONS: No  FALLS:  Has patient fallen in last 6 months? 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    ACTIVITY LEVEL : 3 days of running per week, 2 days of body pump, 1 day of walking   PLOF: Independent with basic ADLs  PATIENT GOALS: to manage the prolapse symptoms, to strengthen the musculature, to ensure prolapse does not progress   BOWEL MOVEMENT: Pain with bowel movement: No Type of bowel movement:Type (Bristol Stool Scale) 4-5, Frequency 1x/day, Strain no, and Splinting no Fully empty rectum: Yes:   Leakage: No                                                     Caused by:  Pads: No Fiber supplement/laxative No  URINATION: Pain with urination: No Fully empty bladder: Yes:                                  Post-void dribble: No Stream: Strong Urgency: Yes  Frequency:during the day within normal limits                                                          Nocturia: Yes: 1x/night   Leakage: none Pads/briefs: No  INTERCOURSE:  Ability to have vaginal penetration Yes  Pain with intercourse: none Dryness: Yes  Climax: yes Marinoff Scale: 0/3 Lubricant: yes but not sure the brand   PREGNANCY: Vaginal deliveries 1 Tearing Yes: with stitches   PROLAPSE: Pressure  OBJECTIVE:  Note: Objective measures were completed at Evaluation unless otherwise noted.  PATIENT SURVEYS:  PFIQ-7: 45  COGNITION: Overall cognitive status: Within functional limits for tasks assessed     SENSATION: Light touch: Appears intact  LUMBAR SPECIAL TESTS:  Straight leg raise test: Positive  FUNCTIONAL TESTS:  Single leg stance:  Rt: +  Lt:+ Sit-up test: 1/3 Squat: within functional limits  Bed mobility: within normal  limits   GAIT: Assistive device utilized: None Comments: mild trendelenburg gait pattern with ambulation   POSTURE: rounded shoulders  LUMBARAROM/PROM: within normal limits for all motions bilaterally with no pain   LOWER EXTREMITY ROM: within normal limits for all motions bilaterally with no pain   LOWER EXTREMITY MMT: 4/5 bilateral hips grossly    PALPATION:  General: no tenderness to palpation of bilateral hip flexors or adductors   Pelvic Alignment: within normal limits   Abdominal: abdominal bracing at rest   Diastasis: No Distortion: No  Breathing: apical breathing pattern with decreased lower rib excursion with inhalation  Scar tissue: No                External Perineal Exam: dryness present with no tenderness to transverse perineal body or pubic bone                              Internal Pelvic Floor: Pt demonstrates anterior vaginal wall weakness that causes prolapse excursion toward introitus with cough test and with bearing down to strain. She demonstrates a weak pelvic floor with a lack of coordination between the diaphragm and pelvic floor musculature. She had no pain during today's examination and no palpable trigger points in the pelvic floor.   Patient confirms identification and approves PT to assess internal pelvic floor and treatment Yes No emotional/communication barriers or cognitive limitation. Patient is motivated to learn. Patient understands and agrees with treatment goals and plan. PT explains patient will be examined in standing, sitting, and lying down to see how their muscles and joints work. When they are ready, they will be asked to remove their underwear so PT can examine their perineum. The patient is also given the option of providing their own chaperone as one is not provided in our facility. The patient also has the right and is explained the right to defer or refuse any part of the evaluation or treatment including the internal exam. With the patient's consent, PT will use one gloved finger to gently assess the muscles of the pelvic floor, seeing how well it contracts and relaxes and if there is muscle symmetry. After, the patient will get dressed and PT and patient will discuss exam findings and plan of care. PT and patient discuss plan of care, schedule, attendance policy and HEP activities.  PELVIC  MMT:   MMT eval  Vaginal 2/5, 4 quick flicks  Internal Anal Sphincter   External Anal Sphincter   Puborectalis   Diastasis Recti   (Blank rows = not tested)  TONE: Low in bilateral aspects of superficial and deep pelvic floor musculature   PROLAPSE: Anterior vaginal wall prolapse grade 2 present in hooklying not extending past introitus   TODAY'S TREATMENT:  DATE:   EVAL 12/04/23: Examination completed, findings reviewed, pt educated on POC, HEP, and self care. Pt motivated to participate in PT and agreeable to attempt recommendations.   - Supine Pelvic Floor Contraction  - 1 x daily - 7 x weekly - 3 sets - 10 reps - Quick Flick Pelvic Floor Contractions in Hooklying  - 1 x daily - 7 x weekly - 3 sets - 10 reps  12/23/23: Seated pelvic floor contraction + diaphragmatic breathing 2x10 (towel roll under perineum to provide tactile feedback)  Seated quick flick pelvic floor contractions + diaphragmatic breathing 2x10  Bridge + pelvic floor contraction + diaphragmatic breathing 2x10  Squat with chair touch + pelvic floor contraction + diaphragmatic breathing 2x10  Double leg raises + diaphragmatic breathing 2x10  Bird dog + diaphragmatic breathing 2x10  Cross body oblique crunch + diaphragmatic breathing 2x10  Lateral lunge (mini lunge) + pelvic floor contraction + diaphragmatic breathing 2x10  Discussion of exhalation during strenuous exercises in body pump class   02/03/24: Seated quick flick pelvic floor contractions x20  Seated diaphragmatic breathing + pelvic floor contraction  Nustep level 1, 5 minutes - PT present to discuss current status  Bridge + hip abduction (GTB) + diaphragmatic breathing 2x10  Seated hip abduction (GTB) + diaphragmatic breathing 2x10 - left side felt weaker  Seated march (GTB) + diaphragmatic breathing 2x10  Squat with chair  touch + 5# weight + diaphragmatic breathing 2x10  Bird dog + diaphragmatic breathing 2x10  Farmer carry 1000' x2 15# kettlebell   PATIENT EDUCATION:  Education details: relative anatomy and the connection between the diaphragm and pelvic floor musculature  Person educated: Patient Education method: Explanation, Demonstration, Tactile cues, Verbal cues, and Handouts Education comprehension: verbalized understanding, returned demonstration, verbal cues required, tactile cues required, and needs further education  HOME EXERCISE PROGRAM: Access Code: [email or web address] URL: [email or web address] Date: 02/03/2024 Prepared by: Celena Domino  Exercises - Seated Pelvic Floor Contraction  - 1 x daily - 7 x weekly - 2 sets - 10 reps - Seated Quick Flick Pelvic Floor Contractions  - 1 x daily - 7 x weekly - 2 sets - 10 reps - Supine Double Bent Leg Lift  - 1 x daily - 7 x weekly - 2 sets - 10 reps - Oblique Crunch  - 1 x daily - 7 x weekly - 2 sets - 10 reps - Bird Dog  - 1 x daily - 7 x weekly - 2 sets - 10 reps - Bridge with Hip Abduction and Resistance  - 1 x daily - 7 x weekly - 2 sets - 10 reps - Seated Hip Abduction with Resistance  - 1 x daily - 7 x weekly - 2 sets - 10 reps - Squat with Chair Touch  - 1 x daily - 7 x weekly - 2 sets - 10 reps - Standing Side Lunge With Pelvic Floor Contraction  - 1 x daily - 7 x weekly - 2 sets - 8 reps - Resistance Pulldown with March  - 1 x daily - 7 x weekly - 2 sets - 10 reps  ASSESSMENT:  CLINICAL IMPRESSION: Patient is a 58 y.o. female  who was seen today for physical therapy treatment for pelvic organ prolapse (stage 2 cystocele). Pt's prolapse has been managed very well in bodypump and she has had less pressure in the pelvic floor overall. She reports feeling stronger and has been more mindful of holding her breath during exercise class. Pt reports after  today's session that her pelvic floor pressure was actually less than at arrival. Overall,  pt tolerated session well and Pt would benefit from additional PT to further address deficits.    OBJECTIVE IMPAIRMENTS: decreased coordination, decreased endurance, decreased mobility, decreased ROM, and decreased strength.   ACTIVITY LIMITATIONS: carrying, lifting, bending, sitting, standing, squatting, stairs, transfers, and bed mobility  PARTICIPATION LIMITATIONS: cleaning, laundry, shopping, community activity, occupation, and yard work  PERSONAL FACTORS: Age, Past/current experiences, and Time since onset of injury/illness/exacerbation are also affecting patient's functional outcome.   REHAB POTENTIAL: Good  CLINICAL DECISION MAKING: Stable/uncomplicated  EVALUATION COMPLEXITY: Low   GOALS: Goals reviewed with patient? Yes  SHORT TERM GOALS: Target date: 01/01/2024  Pt will be independent with HEP.  Baseline: Goal status: INITIAL  2.  Pt will be independent with the knack, urge suppression technique, and double voiding in order to improve bladder habits and decrease urinary incontinence.  Baseline:  Goal status: INITIAL  3.  Pt will be independent with use of squatty potty, relaxed toileting mechanics, and improved bowel movement techniques in order to increase ease of bowel movements and complete evacuation.  Baseline:  Goal status: INITIAL  4.  Pt will be able to correctly perform diaphragmatic breathing and appropriate pressure management in order to prevent worsening vaginal wall laxity and improve pelvic floor A/ROM.  Baseline:  Goal status: INITIAL  LONG TERM GOALS: Target date: 06/03/2024  Pt will be independent with advanced HEP.  Baseline:  Goal status: INITIAL  2.  Pt to demonstrate improved coordination of pelvic floor and breathing mechanics with 10# squat with appropriate synergistic patterns to decrease vaginal pressure at least 75% of the time for improved ability to complete a 30 minute workout with strain at pelvic floor and symptoms.   Baseline:   Goal status: INITIAL  3.  Pt will demonstrate normal pelvic floor muscle tone and A/ROM, able to achieve 3/5 strength with contractions and 10 sec endurance, in order to reduce urinary leaking and number of pads patient wears.   Baseline:  Goal status: INITIAL  4.  Pt will report ability to perform a squat at body pump with little to no pelvic organ prolapse pressure to improve quality of life and allow pt to participate in exercise without pelvic floor dysfunction. Baseline:  Goal status: INITIAL  PLAN:  PT FREQUENCY: 1-2x/week  PT DURATION: 6 months  PLANNED INTERVENTIONS: 97110-Therapeutic exercises, 97530- Therapeutic activity, 97112- Neuromuscular re-education, 97535- Self Care, [id number]- Manual therapy, Patient/Family education, Stair training, Taping, Joint mobilization, Spinal mobilization, Scar mobilization, Cryotherapy, Moist heat, and Biofeedback  PLAN FOR NEXT SESSION: continued pelvic floor AROM in seated, prolapse pressure management techniques, toileting techniques, hip and core strengthening and stretching to back   Celena JAYSON Domino, PT 02/03/2024, 4:27 PM  "

## 2024-02-11 ENCOUNTER — Ambulatory Visit: Admitting: Physical Therapy

## 2024-02-18 ENCOUNTER — Ambulatory Visit: Admitting: Physical Therapy

## 2024-02-25 ENCOUNTER — Ambulatory Visit: Admitting: Physical Therapy

## 2024-03-03 ENCOUNTER — Ambulatory Visit: Admitting: Physical Therapy

## 2024-03-17 ENCOUNTER — Ambulatory Visit: Admitting: Physical Therapy

## 2024-03-24 ENCOUNTER — Ambulatory Visit: Admitting: Physical Therapy

## 2024-03-31 ENCOUNTER — Ambulatory Visit: Admitting: Physical Therapy
